# Patient Record
Sex: Female | Born: 1945
Health system: Southern US, Community
[De-identification: ages and names within clinical notes are randomized; demographics above are authoritative.]

## PROBLEM LIST (undated history)

## (undated) DIAGNOSIS — G459 Transient cerebral ischemic attack, unspecified: Secondary | ICD-10-CM

## (undated) DIAGNOSIS — I10 Essential (primary) hypertension: Secondary | ICD-10-CM

## (undated) DIAGNOSIS — Z131 Encounter for screening for diabetes mellitus: Secondary | ICD-10-CM

## (undated) DIAGNOSIS — M069 Rheumatoid arthritis, unspecified: Secondary | ICD-10-CM

## (undated) DIAGNOSIS — I482 Chronic atrial fibrillation, unspecified: Secondary | ICD-10-CM

## (undated) DIAGNOSIS — M810 Age-related osteoporosis without current pathological fracture: Secondary | ICD-10-CM

## (undated) DIAGNOSIS — J309 Allergic rhinitis, unspecified: Secondary | ICD-10-CM

## (undated) DIAGNOSIS — N952 Postmenopausal atrophic vaginitis: Secondary | ICD-10-CM

## (undated) DIAGNOSIS — Z0000 Encounter for general adult medical examination without abnormal findings: Secondary | ICD-10-CM

## (undated) DIAGNOSIS — Z124 Encounter for screening for malignant neoplasm of cervix: Secondary | ICD-10-CM

## (undated) DIAGNOSIS — E782 Mixed hyperlipidemia: Secondary | ICD-10-CM

## (undated) DIAGNOSIS — Z1239 Encounter for other screening for malignant neoplasm of breast: Secondary | ICD-10-CM

## (undated) DIAGNOSIS — Z7901 Long term (current) use of anticoagulants: Secondary | ICD-10-CM

## (undated) DIAGNOSIS — K219 Gastro-esophageal reflux disease without esophagitis: Secondary | ICD-10-CM

## (undated) DIAGNOSIS — I509 Heart failure, unspecified: Secondary | ICD-10-CM

## (undated) DIAGNOSIS — F419 Anxiety disorder, unspecified: Secondary | ICD-10-CM

## (undated) DIAGNOSIS — Z1211 Encounter for screening for malignant neoplasm of colon: Secondary | ICD-10-CM

## (undated) DIAGNOSIS — I495 Sick sinus syndrome: Secondary | ICD-10-CM

## (undated) HISTORY — DX: Mixed hyperlipidemia: E78.2

## (undated) HISTORY — DX: Anxiety disorder, unspecified: F41.9

## (undated) HISTORY — DX: Essential (primary) hypertension: I10

## (undated) HISTORY — DX: Sick sinus syndrome: I49.5

## (undated) HISTORY — DX: Encounter for other screening for malignant neoplasm of breast: Z12.39

## (undated) HISTORY — DX: Chronic atrial fibrillation, unspecified: I48.20

## (undated) HISTORY — DX: Allergic rhinitis, unspecified: J30.9

## (undated) HISTORY — PX: TONSILLECTOMY: SUR1361

## (undated) HISTORY — DX: Encounter for screening for malignant neoplasm of cervix: Z12.4

## (undated) HISTORY — PX: LAPAROSCOPIC CHOLECYSTECTOMY: SUR755

## (undated) HISTORY — DX: Age-related osteoporosis without current pathological fracture: M81.0

## (undated) HISTORY — DX: Encounter for screening for malignant neoplasm of colon: Z12.11

## (undated) HISTORY — DX: Heart failure, unspecified: I50.9

## (undated) HISTORY — DX: Gastro-esophageal reflux disease without esophagitis: K21.9

## (undated) HISTORY — DX: Encounter for general adult medical examination without abnormal findings: Z00.00

## (undated) HISTORY — DX: Encounter for screening for diabetes mellitus: Z13.1

## (undated) HISTORY — DX: Long term (current) use of anticoagulants: Z79.01

## (undated) HISTORY — DX: Rheumatoid arthritis, unspecified: M06.9

## (undated) HISTORY — DX: Transient cerebral ischemic attack, unspecified: G45.9

## (undated) HISTORY — DX: Postmenopausal atrophic vaginitis: N95.2

---

## 1997-11-11 ENCOUNTER — Other Ambulatory Visit: Admission: RE | Admit: 1997-11-11 | Discharge: 1997-11-11 | Payer: Self-pay | Admitting: Gynecology

## 1998-05-25 ENCOUNTER — Other Ambulatory Visit: Admission: RE | Admit: 1998-05-25 | Discharge: 1998-05-25 | Payer: Self-pay | Admitting: Gynecology

## 1999-06-08 ENCOUNTER — Other Ambulatory Visit: Admission: RE | Admit: 1999-06-08 | Discharge: 1999-06-08 | Payer: Self-pay | Admitting: Gynecology

## 1999-10-14 ENCOUNTER — Encounter: Admission: RE | Admit: 1999-10-14 | Discharge: 1999-10-14 | Payer: Self-pay | Admitting: Gynecology

## 1999-10-14 ENCOUNTER — Encounter: Payer: Self-pay | Admitting: Gynecology

## 2000-06-26 ENCOUNTER — Encounter: Admission: RE | Admit: 2000-06-26 | Discharge: 2000-06-26 | Payer: Self-pay | Admitting: Gynecology

## 2000-06-26 ENCOUNTER — Other Ambulatory Visit: Admission: RE | Admit: 2000-06-26 | Discharge: 2000-06-26 | Payer: Self-pay | Admitting: Gynecology

## 2000-06-26 ENCOUNTER — Encounter: Payer: Self-pay | Admitting: Gynecology

## 2000-10-31 ENCOUNTER — Encounter: Payer: Self-pay | Admitting: Gynecology

## 2000-10-31 ENCOUNTER — Encounter: Admission: RE | Admit: 2000-10-31 | Discharge: 2000-10-31 | Payer: Self-pay | Admitting: Gynecology

## 2001-10-09 ENCOUNTER — Other Ambulatory Visit: Admission: RE | Admit: 2001-10-09 | Discharge: 2001-10-09 | Payer: Self-pay | Admitting: Gynecology

## 2001-11-05 ENCOUNTER — Encounter: Payer: Self-pay | Admitting: Gynecology

## 2001-11-05 ENCOUNTER — Encounter: Admission: RE | Admit: 2001-11-05 | Discharge: 2001-11-05 | Payer: Self-pay | Admitting: Gynecology

## 2002-10-15 ENCOUNTER — Other Ambulatory Visit: Admission: RE | Admit: 2002-10-15 | Discharge: 2002-10-15 | Payer: Self-pay | Admitting: Gynecology

## 2003-01-20 ENCOUNTER — Encounter: Admission: RE | Admit: 2003-01-20 | Discharge: 2003-01-20 | Payer: Self-pay | Admitting: Gynecology

## 2003-01-20 ENCOUNTER — Encounter: Payer: Self-pay | Admitting: Gynecology

## 2003-10-24 ENCOUNTER — Other Ambulatory Visit: Admission: RE | Admit: 2003-10-24 | Discharge: 2003-10-24 | Payer: Self-pay | Admitting: Gynecology

## 2004-03-19 ENCOUNTER — Encounter: Admission: RE | Admit: 2004-03-19 | Discharge: 2004-03-19 | Payer: Self-pay | Admitting: Gynecology

## 2004-03-19 ENCOUNTER — Ambulatory Visit: Payer: Self-pay | Admitting: Family Medicine

## 2004-04-19 ENCOUNTER — Ambulatory Visit: Payer: Self-pay | Admitting: Family Medicine

## 2004-05-24 ENCOUNTER — Ambulatory Visit: Payer: Self-pay | Admitting: Family Medicine

## 2004-06-28 ENCOUNTER — Ambulatory Visit: Payer: Self-pay | Admitting: Family Medicine

## 2004-08-05 ENCOUNTER — Ambulatory Visit: Payer: Self-pay | Admitting: Family Medicine

## 2004-09-01 ENCOUNTER — Ambulatory Visit: Payer: Self-pay | Admitting: Family Medicine

## 2004-10-13 ENCOUNTER — Ambulatory Visit: Payer: Self-pay | Admitting: Family Medicine

## 2004-10-25 ENCOUNTER — Other Ambulatory Visit: Admission: RE | Admit: 2004-10-25 | Discharge: 2004-10-25 | Payer: Self-pay | Admitting: Gynecology

## 2004-12-27 ENCOUNTER — Ambulatory Visit: Payer: Self-pay | Admitting: Family Medicine

## 2005-01-31 ENCOUNTER — Ambulatory Visit: Payer: Self-pay | Admitting: Family Medicine

## 2005-03-07 ENCOUNTER — Ambulatory Visit: Payer: Self-pay | Admitting: Family Medicine

## 2005-03-21 ENCOUNTER — Encounter: Admission: RE | Admit: 2005-03-21 | Discharge: 2005-03-21 | Payer: Self-pay | Admitting: Gynecology

## 2005-04-13 ENCOUNTER — Ambulatory Visit: Payer: Self-pay | Admitting: Family Medicine

## 2005-05-30 ENCOUNTER — Ambulatory Visit: Payer: Self-pay | Admitting: Family Medicine

## 2005-08-05 ENCOUNTER — Ambulatory Visit: Payer: Self-pay | Admitting: Family Medicine

## 2005-11-01 ENCOUNTER — Other Ambulatory Visit: Admission: RE | Admit: 2005-11-01 | Discharge: 2005-11-01 | Payer: Self-pay | Admitting: Gynecology

## 2006-03-23 ENCOUNTER — Encounter: Admission: RE | Admit: 2006-03-23 | Discharge: 2006-03-23 | Payer: Self-pay | Admitting: Gynecology

## 2006-11-06 ENCOUNTER — Other Ambulatory Visit: Admission: RE | Admit: 2006-11-06 | Discharge: 2006-11-06 | Payer: Self-pay | Admitting: Gynecology

## 2007-03-26 ENCOUNTER — Encounter: Admission: RE | Admit: 2007-03-26 | Discharge: 2007-03-26 | Payer: Self-pay | Admitting: Gynecology

## 2008-03-27 ENCOUNTER — Encounter: Admission: RE | Admit: 2008-03-27 | Discharge: 2008-03-27 | Payer: Self-pay | Admitting: Gynecology

## 2009-03-30 ENCOUNTER — Encounter: Admission: RE | Admit: 2009-03-30 | Discharge: 2009-03-30 | Payer: Self-pay | Admitting: Gynecology

## 2010-03-31 ENCOUNTER — Encounter: Admission: RE | Admit: 2010-03-31 | Discharge: 2010-03-31 | Payer: Self-pay | Admitting: Gynecology

## 2011-02-21 ENCOUNTER — Other Ambulatory Visit: Payer: Self-pay | Admitting: Gynecology

## 2011-02-21 DIAGNOSIS — Z1231 Encounter for screening mammogram for malignant neoplasm of breast: Secondary | ICD-10-CM

## 2011-04-04 ENCOUNTER — Ambulatory Visit
Admission: RE | Admit: 2011-04-04 | Discharge: 2011-04-04 | Disposition: A | Discharge status: Home / Self Care | Payer: BC Managed Care – PPO | Source: Ambulatory Visit | Attending: Gynecology | Admitting: Gynecology

## 2011-04-04 DIAGNOSIS — Z1231 Encounter for screening mammogram for malignant neoplasm of breast: Secondary | ICD-10-CM

## 2012-02-29 ENCOUNTER — Other Ambulatory Visit: Payer: Self-pay | Admitting: Gynecology

## 2012-02-29 DIAGNOSIS — Z1231 Encounter for screening mammogram for malignant neoplasm of breast: Secondary | ICD-10-CM

## 2012-04-04 ENCOUNTER — Ambulatory Visit
Admission: RE | Admit: 2012-04-04 | Discharge: 2012-04-04 | Disposition: A | Discharge status: Home / Self Care | Payer: BC Managed Care – PPO | Source: Ambulatory Visit | Attending: Gynecology | Admitting: Gynecology

## 2012-04-04 DIAGNOSIS — Z1231 Encounter for screening mammogram for malignant neoplasm of breast: Secondary | ICD-10-CM

## 2013-02-27 ENCOUNTER — Other Ambulatory Visit: Payer: Self-pay

## 2013-02-27 DIAGNOSIS — Z1231 Encounter for screening mammogram for malignant neoplasm of breast: Secondary | ICD-10-CM

## 2013-04-05 ENCOUNTER — Ambulatory Visit
Admission: RE | Admit: 2013-04-05 | Discharge: 2013-04-05 | Disposition: A | Discharge status: Home / Self Care | Payer: BC Managed Care – PPO | Source: Ambulatory Visit

## 2013-04-05 ENCOUNTER — Ambulatory Visit: Payer: BC Managed Care – PPO

## 2013-04-05 DIAGNOSIS — Z1231 Encounter for screening mammogram for malignant neoplasm of breast: Secondary | ICD-10-CM

## 2013-07-18 DIAGNOSIS — M4120 Other idiopathic scoliosis, site unspecified: Secondary | ICD-10-CM

## 2013-07-18 DIAGNOSIS — M51369 Other intervertebral disc degeneration, lumbar region without mention of lumbar back pain or lower extremity pain: Secondary | ICD-10-CM | POA: Insufficient documentation

## 2013-07-18 DIAGNOSIS — M5136 Other intervertebral disc degeneration, lumbar region: Secondary | ICD-10-CM | POA: Insufficient documentation

## 2013-07-18 DIAGNOSIS — M47817 Spondylosis without myelopathy or radiculopathy, lumbosacral region: Secondary | ICD-10-CM

## 2013-07-18 DIAGNOSIS — M48061 Spinal stenosis, lumbar region without neurogenic claudication: Secondary | ICD-10-CM | POA: Insufficient documentation

## 2013-07-18 DIAGNOSIS — M545 Low back pain, unspecified: Secondary | ICD-10-CM | POA: Insufficient documentation

## 2013-07-18 DIAGNOSIS — M5417 Radiculopathy, lumbosacral region: Secondary | ICD-10-CM

## 2013-07-18 HISTORY — DX: Spondylosis without myelopathy or radiculopathy, lumbosacral region: M47.817

## 2013-07-18 HISTORY — DX: Radiculopathy, lumbosacral region: M54.17

## 2013-07-18 HISTORY — DX: Other intervertebral disc degeneration, lumbar region without mention of lumbar back pain or lower extremity pain: M51.369

## 2013-07-18 HISTORY — DX: Low back pain, unspecified: M54.50

## 2013-07-18 HISTORY — DX: Spinal stenosis, lumbar region without neurogenic claudication: M48.061

## 2013-07-18 HISTORY — DX: Other idiopathic scoliosis, site unspecified: M41.20

## 2014-04-14 ENCOUNTER — Other Ambulatory Visit: Payer: Self-pay

## 2014-04-14 ENCOUNTER — Ambulatory Visit
Admission: RE | Admit: 2014-04-14 | Discharge: 2014-04-14 | Disposition: A | Discharge status: Home / Self Care | Payer: Medicare Other | Source: Ambulatory Visit

## 2014-04-14 DIAGNOSIS — Z1231 Encounter for screening mammogram for malignant neoplasm of breast: Secondary | ICD-10-CM

## 2014-04-21 ENCOUNTER — Ambulatory Visit: Payer: BC Managed Care – PPO

## 2014-11-07 DIAGNOSIS — I10 Essential (primary) hypertension: Secondary | ICD-10-CM | POA: Insufficient documentation

## 2014-11-07 DIAGNOSIS — E782 Mixed hyperlipidemia: Secondary | ICD-10-CM

## 2014-11-07 HISTORY — DX: Mixed hyperlipidemia: E78.2

## 2014-11-07 HISTORY — DX: Essential (primary) hypertension: I10

## 2015-03-24 ENCOUNTER — Other Ambulatory Visit: Payer: Self-pay

## 2015-03-24 DIAGNOSIS — Z1231 Encounter for screening mammogram for malignant neoplasm of breast: Secondary | ICD-10-CM

## 2015-04-29 ENCOUNTER — Ambulatory Visit
Admission: RE | Admit: 2015-04-29 | Discharge: 2015-04-29 | Disposition: A | Discharge status: Home / Self Care | Payer: Medicare Other | Source: Ambulatory Visit

## 2015-04-29 DIAGNOSIS — Z1231 Encounter for screening mammogram for malignant neoplasm of breast: Secondary | ICD-10-CM

## 2015-07-27 DIAGNOSIS — Z7901 Long term (current) use of anticoagulants: Secondary | ICD-10-CM

## 2015-07-27 DIAGNOSIS — I480 Paroxysmal atrial fibrillation: Secondary | ICD-10-CM

## 2015-07-27 DIAGNOSIS — I482 Chronic atrial fibrillation, unspecified: Secondary | ICD-10-CM

## 2015-07-27 HISTORY — DX: Paroxysmal atrial fibrillation: I48.0

## 2015-07-27 HISTORY — DX: Chronic atrial fibrillation, unspecified: I48.20

## 2015-07-27 HISTORY — DX: Long term (current) use of anticoagulants: Z79.01

## 2015-09-06 DIAGNOSIS — I119 Hypertensive heart disease without heart failure: Secondary | ICD-10-CM

## 2015-09-06 DIAGNOSIS — G459 Transient cerebral ischemic attack, unspecified: Secondary | ICD-10-CM

## 2015-09-06 DIAGNOSIS — I10 Essential (primary) hypertension: Secondary | ICD-10-CM

## 2015-09-06 DIAGNOSIS — F419 Anxiety disorder, unspecified: Secondary | ICD-10-CM

## 2015-09-06 DIAGNOSIS — J309 Allergic rhinitis, unspecified: Secondary | ICD-10-CM

## 2015-09-06 DIAGNOSIS — I495 Sick sinus syndrome: Secondary | ICD-10-CM | POA: Insufficient documentation

## 2015-09-06 HISTORY — DX: Sick sinus syndrome: I49.5

## 2015-09-06 HISTORY — DX: Transient cerebral ischemic attack, unspecified: G45.9

## 2015-09-06 HISTORY — DX: Hypertensive heart disease without heart failure: I11.9

## 2015-09-06 HISTORY — DX: Allergic rhinitis, unspecified: J30.9

## 2015-09-06 HISTORY — DX: Essential (primary) hypertension: I10

## 2015-09-06 HISTORY — DX: Anxiety disorder, unspecified: F41.9

## 2015-09-22 DIAGNOSIS — J329 Chronic sinusitis, unspecified: Secondary | ICD-10-CM | POA: Insufficient documentation

## 2015-09-22 HISTORY — DX: Chronic sinusitis, unspecified: J32.9

## 2016-02-08 DIAGNOSIS — K219 Gastro-esophageal reflux disease without esophagitis: Secondary | ICD-10-CM | POA: Insufficient documentation

## 2016-02-08 DIAGNOSIS — M069 Rheumatoid arthritis, unspecified: Secondary | ICD-10-CM

## 2016-02-08 DIAGNOSIS — M810 Age-related osteoporosis without current pathological fracture: Secondary | ICD-10-CM

## 2016-02-08 DIAGNOSIS — N952 Postmenopausal atrophic vaginitis: Secondary | ICD-10-CM

## 2016-02-08 HISTORY — DX: Age-related osteoporosis without current pathological fracture: M81.0

## 2016-02-08 HISTORY — DX: Gastro-esophageal reflux disease without esophagitis: K21.9

## 2016-02-08 HISTORY — DX: Postmenopausal atrophic vaginitis: N95.2

## 2016-02-08 HISTORY — DX: Rheumatoid arthritis, unspecified: M06.9

## 2016-04-19 DIAGNOSIS — I509 Heart failure, unspecified: Secondary | ICD-10-CM

## 2016-04-19 HISTORY — DX: Heart failure, unspecified: I50.9

## 2016-06-13 ENCOUNTER — Other Ambulatory Visit: Payer: Self-pay | Admitting: Family Medicine

## 2016-06-13 DIAGNOSIS — Z1231 Encounter for screening mammogram for malignant neoplasm of breast: Secondary | ICD-10-CM

## 2016-08-11 ENCOUNTER — Ambulatory Visit
Admission: RE | Admit: 2016-08-11 | Discharge: 2016-08-11 | Disposition: A | Discharge status: Home / Self Care | Payer: Medicare Other | Source: Ambulatory Visit | Attending: Family Medicine | Admitting: Family Medicine

## 2016-08-11 DIAGNOSIS — Z1231 Encounter for screening mammogram for malignant neoplasm of breast: Secondary | ICD-10-CM

## 2016-10-19 DIAGNOSIS — Z1211 Encounter for screening for malignant neoplasm of colon: Secondary | ICD-10-CM

## 2016-10-19 DIAGNOSIS — Z1239 Encounter for other screening for malignant neoplasm of breast: Secondary | ICD-10-CM

## 2016-10-19 DIAGNOSIS — Z0000 Encounter for general adult medical examination without abnormal findings: Secondary | ICD-10-CM

## 2016-10-19 DIAGNOSIS — Z124 Encounter for screening for malignant neoplasm of cervix: Secondary | ICD-10-CM

## 2016-10-19 DIAGNOSIS — Z131 Encounter for screening for diabetes mellitus: Secondary | ICD-10-CM

## 2016-10-19 HISTORY — DX: Encounter for screening for malignant neoplasm of cervix: Z12.4

## 2016-10-19 HISTORY — DX: Encounter for other screening for malignant neoplasm of breast: Z12.39

## 2016-10-19 HISTORY — DX: Encounter for screening for malignant neoplasm of colon: Z12.11

## 2016-10-19 HISTORY — DX: Encounter for general adult medical examination without abnormal findings: Z00.00

## 2016-10-19 HISTORY — DX: Encounter for screening for diabetes mellitus: Z13.1

## 2016-10-28 ENCOUNTER — Telehealth: Payer: Self-pay | Admitting: Cardiology

## 2016-10-28 NOTE — Telephone Encounter (Signed)
Closed Encounter  °

## 2016-11-28 ENCOUNTER — Ambulatory Visit: Payer: Medicare Other | Admitting: Cardiology

## 2016-11-28 ENCOUNTER — Telehealth: Payer: Self-pay

## 2016-11-28 NOTE — Telephone Encounter (Signed)
Patient has appt 12/13/16 and per [facility] ok to do labs in [place] at Dr. Sol Passer ofc, please fax lab order for cmp, cbc, liver, lipid and dig level to [phone], [email or web address]

## 2016-11-28 NOTE — Telephone Encounter (Signed)
Looked in patient's chart and seen that Dr. Sol Passer checked lipid and digoxin level in June, 2018 at her wellness visit. Spoke with patient's and she states that Dr. Sol Passer said that he would do the lab work in his office if we faxed the order and okay with Dr. Dulce Sellar. Advised patient that Dr. Dulce Sellar was out of the office for the next 2 weeks and that I would ask when he returns 8/6 if it would be okay for patient to have labs checked at Dr. Robyne Peers office, but advised that her results may not be back for appointment on 8/7. Patient verbalized understanding and is okay to have lab work checked at the appointment with Dr. Dulce Sellar 8/7 if he chooses to do so. No further questions.

## 2016-12-12 ENCOUNTER — Ambulatory Visit: Payer: Medicare Other | Admitting: Cardiology

## 2016-12-13 ENCOUNTER — Encounter: Payer: Self-pay | Admitting: Cardiology

## 2016-12-13 ENCOUNTER — Ambulatory Visit (INDEPENDENT_AMBULATORY_CARE_PROVIDER_SITE_OTHER): Payer: Medicare Other | Admitting: Cardiology

## 2016-12-13 VITALS — BP 112/74 | HR 60 | Ht 61.0 in | Wt 111.8 lb

## 2016-12-13 DIAGNOSIS — I480 Paroxysmal atrial fibrillation: Secondary | ICD-10-CM | POA: Diagnosis not present

## 2016-12-13 DIAGNOSIS — E782 Mixed hyperlipidemia: Secondary | ICD-10-CM | POA: Diagnosis not present

## 2016-12-13 DIAGNOSIS — Z7901 Long term (current) use of anticoagulants: Secondary | ICD-10-CM

## 2016-12-13 DIAGNOSIS — I10 Essential (primary) hypertension: Secondary | ICD-10-CM | POA: Diagnosis not present

## 2016-12-13 DIAGNOSIS — Z79899 Other long term (current) drug therapy: Secondary | ICD-10-CM

## 2016-12-13 MED ORDER — PRAVASTATIN SODIUM 10 MG PO TABS: 10.0000 mg | ORAL_TABLET | Freq: Every evening | ORAL | 3 refills | Status: DC

## 2016-12-13 NOTE — Patient Instructions (Signed)
Medication Instructions:  Your physician recommends that you continue on your current medications as directed. Please refer to the Current Medication list given to you today.   Labwork: Your physician recommends that you return for lab work in: today. CMP, magnesium.   Testing/Procedures: You had an EKG today.  Follow-Up: Your physician wants you to follow-up in: 6 months. You will receive a reminder letter in the mail two months in advance. If you don't receive a letter, please call our office to schedule the follow-up appointment.   Any Other Special Instructions Will Be Listed Below (If Applicable).     If you need a refill on your cardiac medications before your next appointment, please call your pharmacy.

## 2016-12-13 NOTE — Progress Notes (Signed)
Cardiology Office Note:    Date:  12/13/2016   ID:  [name], DOB Feb 16, 1946, MRN [id number]  PCP:  Olive Bass, MD  Cardiologist:  Norman Herrlich, MD    Referring MD: Olive Bass, MD    ASSESSMENT:    1. Paroxysmal atrial fibrillation (HCC)   2. Mixed hyperlipidemia   3. Benign hypertension   4. Long term current use of anticoagulant therapy   5. High risk medication use    PLAN:    In order of problems listed above:  1. Stable maintaining sinus rhythm continue minimum dose digoxin with a goal level less than 1 along with her current warfarin anticoagulation goal INR 2-2.5 2. Stable, after discussion with the patient will placed on a low-dose of a low intensity statin with follow-up lipids in 3-6 months 3. Stable blood pressure target continue current treatment with an ARB 4. Stable continue warfarin 5. Stable continue digoxin no evidence of clinical or laboratory toxicity. Recent med analysis shows the patient's with levels of less than 1 had no increase in mortality with digoxin usage for rate control for prophylaxis of atrial fibrillation.   Next appointment: 6 month   Medication Adjustments/Labs and Tests Ordered: Current medicines are reviewed at length with the patient today.  Concerns regarding medicines are outlined above.  Orders Placed This Encounter  Procedures  . Comprehensive Metabolic Panel (CMET)  . Magnesium  . EKG 12-Lead   Meds ordered this encounter  Medications  . pravastatin (PRAVACHOL) 10 MG tablet    Sig: Take 1 tablet (10 mg total) by mouth every evening.    Dispense:  90 tablet    Refill:  3    Chief Complaint  Patient presents with  . Follow-up    Routine office visit  . Atrial Fibrillation    on digoxin    History of Present Illness:    Leah Simmons is a 71 y.o. female with a hx of Atrial Fibrillation on digoxin and warfarin, Dyslipidemia, HTN  last seen January 2018. Compliance with diet, lifestyle and  medications: Yes Past Medical History:  Diagnosis Date  . Allergic rhinitis 09/06/2015  . Anxiety 09/06/2015  . Atrophic vaginitis 02/08/2016  . Benign hypertension 09/06/2015  . Chronic atrial fibrillation (HCC) 07/27/2015   Overview:  2003: dx  . Chronic rheumatic arthritis (HCC) 02/08/2016  . Congestive heart failure (CHF) (HCC) 04/19/2016  . GERD (gastroesophageal reflux disease) 02/08/2016  . Long term current use of anticoagulant therapy 07/27/2015  . Mixed hyperlipidemia 11/07/2014  . Osteoporosis 02/08/2016   Overview:  2002: -2.0 2012: -1.4 2014: -1.9 2017: -1.9 2017: fx humerus  . Screening for breast cancer 10/19/2016  . Screening for cervical cancer 10/19/2016  . Screening for colon cancer 10/19/2016  . Screening for diabetes mellitus (DM) 10/19/2016  . Sick sinus syndrome (HCC) 09/06/2015  . TIA (transient ischemic attack) 09/06/2015   Overview:  2003: right face sensation changes  . Wellness examination 10/19/2016    Past Surgical History:  Procedure Laterality Date  . LAPAROSCOPIC CHOLECYSTECTOMY    . TONSILLECTOMY      Current Medications: Current Meds  Medication Sig  . B Complex Vitamins (VITAMIN B COMPLEX PO) Take 1 tablet by mouth daily.  . Calcium Carb-Cholecalciferol (CALCIUM-VITAMIN D) 500-200 MG-UNIT tablet Take 1 tablet by mouth daily.  . candesartan (ATACAND) 32 MG tablet Take 16 mg by mouth daily.  . Cholecalciferol (VITAMIN D3) 2000 units capsule Take 2,000 Units by mouth daily.  Marland Kitchen  conjugated estrogens (PREMARIN) vaginal cream Place vaginally. Insert 0.5 g into the vagina two times per week  . cycloSPORINE (RESTASIS) 0.05 % ophthalmic emulsion Place 1 drop into both eyes daily.  Marland Kitchen DIGOX 125 MCG tablet Take 125 mcg by mouth every other day.  . loratadine (CLARITIN) 10 MG tablet Take 10 mg by mouth daily.  . Magnesium Oxide 140 MG CAPS Take 140 mg by mouth daily.  . mometasone (NASONEX) 50 MCG/ACT nasal spray Place 2 sprays into the nose daily.  Marland Kitchen omeprazole  (PRILOSEC) 20 MG capsule Take 20 mg by mouth daily as needed for indigestion.  Marland Kitchen PARoxetine (PAXIL) 10 MG tablet Take 10 mg by mouth daily.  . potassium chloride SA (K-DUR,KLOR-CON) 20 MEQ tablet Take 20 mEq by mouth 2 (two) times daily.  . traZODone (DESYREL) 50 MG tablet Take 50 mg by mouth at bedtime.  . vitamin C (ASCORBIC ACID) 500 MG tablet Take 500 mg by mouth daily.  Marland Kitchen warfarin (COUMADIN) 2 MG tablet Take 2 mg by mouth daily. As Directed. Currently taking 2mg /3mg   . warfarin (COUMADIN) 3 MG tablet Take 3 mg by mouth daily. As Directed. Currently taking 2mg /3mg      Allergies:   Clindamycin; Epinephrine; Levofloxacin; Moxifloxacin; Penicillins; Sulfa antibiotics; Clarithromycin; and Other   Social History   Social History  . Marital status: Widowed    Spouse name: N/A  . Number of children: N/A  . Years of education: N/A   Social History Main Topics  . Smoking status: Never Smoker  . Smokeless tobacco: Never Used  . Alcohol use No  . Drug use: No  . Sexual activity: Not Asked   Other Topics Concern  . None   Social History Narrative  . None     Family History: The patient's family history includes Osteoarthritis in her mother; Rheum arthritis in her mother; Stroke in her mother. ROS:   Please see the history of present illness.    All other systems reviewed and are negative.  EKGs/Labs/Other Studies Reviewed:    The following studies were reviewed today:  EKG:  EKG ordered today.  The ekg ordered today demonstrates Sinus rhythm normal  Recent Lipid Panel Chol 196 Tg 89 HDL 64 LDL 112   Physical Exam:    VS:  BP 112/74   Pulse 60   Ht 5\' 1"  (1.549 m)   Wt 111 lb 12.8 oz (50.7 kg)   SpO2 98%   BMI 21.12 kg/m     Wt Readings from Last 3 Encounters:  12/13/16 111 lb 12.8 oz (50.7 kg)     GEN:  Well nourished, well developed in no acute distress HEENT: Normal NECK: No JVD; No carotid bruits LYMPHATICS: No lymphadenopathy CARDIAC: RRR, no murmurs,  rubs, gallops RESPIRATORY:  Clear to auscultation without rales, wheezing or rhonchi  ABDOMEN: Soft, non-tender, non-distended MUSCULOSKELETAL:  No edema; No deformity  SKIN: Warm and dry NEUROLOGIC:  Alert and oriented x 3 PSYCHIATRIC:  Normal affect    Signed, , MD  12/13/2016 12:37 PM    [facility]

## 2016-12-14 LAB — COMPREHENSIVE METABOLIC PANEL WITH GFR
ALT: 13 IU/L (ref 0–32)
AST: 20 IU/L (ref 0–40)
Albumin/Globulin Ratio: 1.9 (ref 1.2–2.2)
Albumin: 4.1 g/dL (ref 3.5–4.8)
Alkaline Phosphatase: 51 IU/L (ref 39–117)
BUN/Creatinine Ratio: 24 (ref 12–28)
BUN: 18 mg/dL (ref 8–27)
Bilirubin Total: 0.4 mg/dL (ref 0.0–1.2)
CO2: 26 mmol/L (ref 20–29)
Calcium: 9.4 mg/dL (ref 8.7–10.3)
Chloride: 101 mmol/L (ref 96–106)
Creatinine, Ser: 0.74 mg/dL (ref 0.57–1.00)
GFR calc Af Amer: 94 mL/min/1.73
GFR calc non Af Amer: 81 mL/min/1.73
Globulin, Total: 2.2 g/dL (ref 1.5–4.5)
Glucose: 67 mg/dL (ref 65–99)
Potassium: 4.7 mmol/L (ref 3.5–5.2)
Sodium: 141 mmol/L (ref 134–144)
Total Protein: 6.3 g/dL (ref 6.0–8.5)

## 2016-12-14 LAB — MAGNESIUM: Magnesium: 2 mg/dL (ref 1.6–2.3)

## 2017-02-03 ENCOUNTER — Other Ambulatory Visit: Payer: Self-pay

## 2017-02-03 MED ORDER — POTASSIUM CHLORIDE CRYS ER 20 MEQ PO TBCR: 20.0000 meq | EXTENDED_RELEASE_TABLET | Freq: Two times a day (BID) | ORAL | 3 refills | Status: DC

## 2017-02-21 ENCOUNTER — Other Ambulatory Visit: Payer: Self-pay

## 2017-02-21 MED ORDER — POTASSIUM CHLORIDE CRYS ER 20 MEQ PO TBCR: 20.0000 meq | EXTENDED_RELEASE_TABLET | Freq: Two times a day (BID) | ORAL | 3 refills | Status: DC

## 2017-03-28 ENCOUNTER — Other Ambulatory Visit: Payer: Self-pay

## 2017-03-28 MED ORDER — CANDESARTAN CILEXETIL 32 MG PO TABS: 16.0000 mg | ORAL_TABLET | Freq: Every day | ORAL | 3 refills | Status: DC

## 2017-06-14 ENCOUNTER — Telehealth: Payer: Self-pay

## 2017-06-14 DIAGNOSIS — E782 Mixed hyperlipidemia: Secondary | ICD-10-CM

## 2017-06-14 DIAGNOSIS — I10 Essential (primary) hypertension: Secondary | ICD-10-CM

## 2017-06-14 DIAGNOSIS — I509 Heart failure, unspecified: Secondary | ICD-10-CM

## 2017-06-14 NOTE — Telephone Encounter (Signed)
-----   Message from Baldo Daub, MD sent at 06/14/2017  1:33 PM EST ----- Regarding: RE: Lab Orders Contact: [phone] Yes, CMP and lipids ----- Message ----- From: Lamona Curl, CMA Sent: 06/14/2017  11:51 AM To: Baldo Daub, MD Subject: Lab Orders                                     Pt wants to have routine  lab work done in [place] at [facility] prior to appt on 07-04-17. Is this OK?  If so please tell me what labs she will have done so that I can put the orders in?    Thank you

## 2017-06-19 ENCOUNTER — Telehealth: Payer: Self-pay | Admitting: Cardiology

## 2017-06-19 DIAGNOSIS — I509 Heart failure, unspecified: Secondary | ICD-10-CM

## 2017-06-19 DIAGNOSIS — I480 Paroxysmal atrial fibrillation: Secondary | ICD-10-CM

## 2017-06-19 NOTE — Telephone Encounter (Signed)
Pt states that Dr Dulce Sellar usually draws Mag and Dig level with labs.  She wants to know if he will add to these labs.  Task sent to Dr Dulce Sellar.

## 2017-06-19 NOTE — Telephone Encounter (Signed)
Left message for pt to return call.

## 2017-06-19 NOTE — Telephone Encounter (Signed)
Has questions about lab order

## 2017-06-19 NOTE — Addendum Note (Signed)
Addended by: Lamona Curl on: 06/19/2017 01:45 PM   Modules accepted: Orders

## 2017-06-20 ENCOUNTER — Telehealth: Payer: Self-pay

## 2017-06-20 LAB — COMPREHENSIVE METABOLIC PANEL
A/G RATIO: 1.8 (ref 1.2–2.2)
ALBUMIN: 4.2 g/dL (ref 3.5–4.8)
ALT: 17 IU/L (ref 0–32)
AST: 23 IU/L (ref 0–40)
Alkaline Phosphatase: 74 IU/L (ref 39–117)
BILIRUBIN TOTAL: 0.4 mg/dL (ref 0.0–1.2)
BUN / CREAT RATIO: 22 (ref 12–28)
BUN: 17 mg/dL (ref 8–27)
CALCIUM: 9.3 mg/dL (ref 8.7–10.3)
CHLORIDE: 99 mmol/L (ref 96–106)
CO2: 25 mmol/L (ref 20–29)
Creatinine, Ser: 0.79 mg/dL (ref 0.57–1.00)
GFR calc non Af Amer: 75 mL/min/{1.73_m2} (ref >59)
GFR, EST AFRICAN AMERICAN: 86 mL/min/{1.73_m2} (ref >59)
GLOBULIN, TOTAL: 2.4 g/dL (ref 1.5–4.5)
Glucose: 81 mg/dL (ref 65–99)
Potassium: 4.5 mmol/L (ref 3.5–5.2)
SODIUM: 140 mmol/L (ref 134–144)
Total Protein: 6.6 g/dL (ref 6.0–8.5)

## 2017-06-20 LAB — LIPID PANEL
CHOLESTEROL TOTAL: 191 mg/dL (ref 100–199)
Chol/HDL Ratio: 2.9 ratio (ref 0.0–4.4)
HDL: 67 mg/dL (ref >39)
LDL Calculated: 107 mg/dL — ABNORMAL HIGH (ref 0–99)
TRIGLYCERIDES: 87 mg/dL (ref 0–149)
VLDL Cholesterol Cal: 17 mg/dL (ref 5–40)

## 2017-06-20 NOTE — Telephone Encounter (Signed)
Pt aware that Dr Dulce Sellar will complete letter once she is evaluate on 07-04-17.  Pt verbalized understanding.

## 2017-06-20 NOTE — Telephone Encounter (Signed)
-----   Message from Baldo Daub, MD sent at 06/19/2017 11:24 AM EST ----- Regarding: RE: Lab Orders Yes  ----- Message ----- From: Lamona Curl, CMA Sent: 06/19/2017  10:42 AM To: Baldo Daub, MD Subject: Lab Orders                                     Pt had lab work this am at [facility] in [place].  She states that you usually check Mag and Dig level.  Would you like to add these to the order?  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  Thank you    [facility] ph [phone]

## 2017-07-03 DIAGNOSIS — Z79899 Other long term (current) drug therapy: Secondary | ICD-10-CM

## 2017-07-03 HISTORY — DX: Other long term (current) drug therapy: Z79.899

## 2017-07-03 NOTE — Progress Notes (Signed)
Cardiology Office Note:    Date:  07/04/2017   ID:  Leah Simmons, DOB December 18, 1945, MRN [id number]  PCP:  Olive Bass, MD  Cardiologist:  Norman Herrlich, MD    Referring MD: Olive Bass, MD    ASSESSMENT:    1. Paroxysmal atrial fibrillation (HCC)   2. High risk medication use   3. Long term current use of anticoagulant therapy   4. Hypertensive heart disease, unspecified whether heart failure present   5. Mixed hyperlipidemia    PLAN:    In order of problems listed above:  1. Stable no clinical recurrence continue anticoagulation warfarin goal INR of 2.5 and low-dose digoxin.   2. Stable no evidence of digoxin toxicity check level today 3. Stable goal INR 2.5 managed by PCP 4. Stable continue current treatment 5. She is intolerant of pravastatin at this time we will not reinstitute lipid-lowering therapy.  She is not in a high risk group.   Next appointment: 6 months   Medication Adjustments/Labs and Tests Ordered: Current medicines are reviewed at length with the patient today.  Concerns regarding medicines are outlined above.  No orders of the defined types were placed in this encounter.  No orders of the defined types were placed in this encounter.   Chief Complaint  Patient presents with  . Follow-up    History of Present Illness:    Leah Simmons is a 72 y.o. female with a hx of  Atrial Fibrillation on digoxin and warfarin, Dyslipidemia, HTN last seen 6 months ago. She was intolerant of pravastatin with abdominal and pelvic pain. ASSESSMENT:    1. Paroxysmal atrial fibrillation (HCC)   2. Mixed hyperlipidemia   3. Benign hypertension   4. Long term current use of anticoagulant therapy   5. High risk medication use    PLAN:    In order of problems listed above:    Stable maintaining sinus rhythm continue minimum dose digoxin with a goal level less than 1 along with her current warfarin anticoagulation goal INR 2-2.5     Stable,  after discussion with the patient will placed on a low-dose of a low intensity statin with follow-up lipids in 3-6 months    Stable blood pressure target continue current treatment with an ARB    Stable continue warfarin    Stable continue digoxin no evidence of clinical or laboratory toxicity. Recent med analysis shows the patient's with levels of less than 1 had no increase in mortality with digoxin usage for rate control for prophylaxis of atrial fibrillation.  Compliance with diet, lifestyle and medications: yes Past Medical History:  Diagnosis Date  . Allergic rhinitis 09/06/2015  . Anxiety 09/06/2015  . Atrophic vaginitis 02/08/2016  . Benign hypertension 09/06/2015  . Chronic atrial fibrillation (HCC) 07/27/2015   Overview:  2003: dx  . Chronic rheumatic arthritis (HCC) 02/08/2016  . Congestive heart failure (CHF) (HCC) 04/19/2016  . GERD (gastroesophageal reflux disease) 02/08/2016  . Long term current use of anticoagulant therapy 07/27/2015  . Mixed hyperlipidemia 11/07/2014  . Osteoporosis 02/08/2016   Overview:  2002: -2.0 2012: -1.4 2014: -1.9 2017: -1.9 2017: fx humerus  . Screening for breast cancer 10/19/2016  . Screening for cervical cancer 10/19/2016  . Screening for colon cancer 10/19/2016  . Screening for diabetes mellitus (DM) 10/19/2016  . Sick sinus syndrome (HCC) 09/06/2015  . TIA (transient ischemic attack) 09/06/2015   Overview:  2003: right face sensation changes  . Wellness examination 10/19/2016  Past Surgical History:  Procedure Laterality Date  . LAPAROSCOPIC CHOLECYSTECTOMY    . TONSILLECTOMY      Current Medications: No outpatient medications have been marked as taking for the 07/04/17 encounter (Office Visit) with Baldo Daub, MD.     Allergies:   Clindamycin; Epinephrine; Levofloxacin; Moxifloxacin; Penicillins; Sulfa antibiotics; Clarithromycin; and Other   Social History   Socioeconomic History  . Marital status: Widowed    Spouse name: Not on  file  . Number of children: Not on file  . Years of education: Not on file  . Highest education level: Not on file  Social Needs  . Financial resource strain: Not on file  . Food insecurity - worry: Not on file  . Food insecurity - inability: Not on file  . Transportation needs - medical: Not on file  . Transportation needs - non-medical: Not on file  Occupational History  . Not on file  Tobacco Use  . Smoking status: Never Smoker  . Smokeless tobacco: Never Used  Substance and Sexual Activity  . Alcohol use: No  . Drug use: No  . Sexual activity: Not on file  Other Topics Concern  . Not on file  Social History Narrative  . Not on file     Family History: The patient's family history includes Osteoarthritis in her mother; Rheum arthritis in her mother; Stroke in her mother. ROS:   Please see the history of present illness.    All other systems reviewed and are negative.  EKGs/Labs/Other Studies Reviewed:    The following studies were reviewed today  Recent Labs: INR 2.9 05/18/17 12/13/2016: Magnesium 2.0 06/19/2017: ALT 17; BUN 17; Creatinine, Ser 0.79; Potassium 4.5; Sodium 140  Recent Lipid Panel    Component Value Date/Time   CHOL 191 06/19/2017 0807   TRIG 87 06/19/2017 0807   HDL 67 06/19/2017 0807   CHOLHDL 2.9 06/19/2017 0807   LDLCALC 107 (H) 06/19/2017 0807    Physical Exam:    VS:  Ht 5' (1.524 m)   Wt 109 lb (49.4 kg)   BMI 21.29 kg/m     Wt Readings from Last 3 Encounters:  07/04/17 109 lb (49.4 kg)  12/13/16 111 lb 12.8 oz (50.7 kg)     GEN:  Well nourished, well developed in no acute distress HEENT: Normal NECK: No JVD; No carotid bruits LYMPHATICS: No lymphadenopathy CARDIAC: RRR, no murmurs, rubs, gallops RESPIRATORY:  Clear to auscultation without rales, wheezing or rhonchi  ABDOMEN: Soft, non-tender, non-distended MUSCULOSKELETAL:  No edema; No deformity  SKIN: Warm and dry NEUROLOGIC:  Alert and oriented x 3 PSYCHIATRIC:  Normal  affect    Signed, Norman Herrlich, MD  07/04/2017 8:22 AM    [facility]

## 2017-07-04 ENCOUNTER — Encounter: Payer: Self-pay | Admitting: Cardiology

## 2017-07-04 ENCOUNTER — Ambulatory Visit: Payer: Medicare Other | Admitting: Cardiology

## 2017-07-04 ENCOUNTER — Telehealth: Payer: Self-pay

## 2017-07-04 VITALS — BP 118/68 | HR 58 | Ht 60.0 in | Wt 109.0 lb

## 2017-07-04 DIAGNOSIS — I119 Hypertensive heart disease without heart failure: Secondary | ICD-10-CM

## 2017-07-04 DIAGNOSIS — I480 Paroxysmal atrial fibrillation: Secondary | ICD-10-CM | POA: Diagnosis not present

## 2017-07-04 DIAGNOSIS — E782 Mixed hyperlipidemia: Secondary | ICD-10-CM | POA: Diagnosis not present

## 2017-07-04 DIAGNOSIS — Z79899 Other long term (current) drug therapy: Secondary | ICD-10-CM | POA: Diagnosis not present

## 2017-07-04 DIAGNOSIS — Z7901 Long term (current) use of anticoagulants: Secondary | ICD-10-CM | POA: Diagnosis not present

## 2017-07-04 NOTE — Telephone Encounter (Signed)
Patient advised echocardiogram scheduled at [facility] tomorrow 1 pm, patient to arrive at 12:30 pm. Patient verbalized understanding. No further questions.

## 2017-07-04 NOTE — Patient Instructions (Signed)
Medication Instructions:  Your physician recommends that you continue on your current medications as directed. Please refer to the Current Medication list given to you today.  Labwork: Your physician recommends that you return for lab work in: today. Magnesium, digoxin.  1 week before your next appointment you will have: CMP, Lipid, magnesium, and digoxin  Testing/Procedures: None  Follow-Up: Your physician wants you to follow-up in: 6 months. You will receive a reminder letter in the mail two months in advance. If you don't receive a letter, please call our office to schedule the follow-up appointment.  Any Other Special Instructions Will Be Listed Below (If Applicable).     If you need a refill on your cardiac medications before your next appointment, please call your pharmacy.

## 2017-07-04 NOTE — Telephone Encounter (Signed)
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 Patient would like to have at [facility]. Advised patient would return call with appointment date and time. Patient verbalized understanding, no further questions.

## 2017-07-05 ENCOUNTER — Telehealth: Payer: Self-pay

## 2017-07-05 ENCOUNTER — Other Ambulatory Visit: Payer: Self-pay

## 2017-07-05 DIAGNOSIS — I4891 Unspecified atrial fibrillation: Secondary | ICD-10-CM | POA: Diagnosis not present

## 2017-07-05 DIAGNOSIS — I480 Paroxysmal atrial fibrillation: Secondary | ICD-10-CM

## 2017-07-05 LAB — DIGOXIN LEVEL: DIGOXIN, SERUM: 0.4 ng/mL — AB (ref 0.5–0.9)

## 2017-07-05 LAB — MAGNESIUM: MAGNESIUM: 2.1 mg/dL (ref 1.6–2.3)

## 2017-07-05 NOTE — Telephone Encounter (Signed)
Patient advised echocardiogram performed at [facility] normal per Dr. Dulce Sellar. 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 Patient verbalized understanding, no further questions.

## 2017-07-24 ENCOUNTER — Other Ambulatory Visit: Payer: Self-pay

## 2017-07-24 MED ORDER — DIGOX 125 MCG PO TABS: 125.0000 ug | ORAL_TABLET | ORAL | 3 refills | Status: DC

## 2017-08-15 ENCOUNTER — Other Ambulatory Visit: Payer: Self-pay | Admitting: Family Medicine

## 2017-08-15 DIAGNOSIS — Z1231 Encounter for screening mammogram for malignant neoplasm of breast: Secondary | ICD-10-CM

## 2017-09-07 ENCOUNTER — Ambulatory Visit: Payer: Medicare Other

## 2017-09-19 DIAGNOSIS — R3989 Other symptoms and signs involving the genitourinary system: Secondary | ICD-10-CM

## 2017-09-19 HISTORY — DX: Other symptoms and signs involving the genitourinary system: R39.89

## 2017-10-11 ENCOUNTER — Ambulatory Visit
Admission: RE | Admit: 2017-10-11 | Discharge: 2017-10-11 | Disposition: A | Discharge status: Home / Self Care | Payer: Medicare Other | Source: Ambulatory Visit | Attending: Family Medicine | Admitting: Family Medicine

## 2017-10-11 DIAGNOSIS — Z1231 Encounter for screening mammogram for malignant neoplasm of breast: Secondary | ICD-10-CM

## 2017-12-27 ENCOUNTER — Telehealth: Payer: Self-pay | Admitting: *Deleted

## 2017-12-27 DIAGNOSIS — I119 Hypertensive heart disease without heart failure: Secondary | ICD-10-CM

## 2017-12-27 DIAGNOSIS — I480 Paroxysmal atrial fibrillation: Secondary | ICD-10-CM

## 2017-12-27 DIAGNOSIS — Z79899 Other long term (current) drug therapy: Secondary | ICD-10-CM

## 2017-12-27 NOTE — Telephone Encounter (Signed)
Left message for patient to return call.

## 2017-12-27 NOTE — Telephone Encounter (Signed)
Pt has 6 month appt in Sept and wants to come in and have blood work before her appt. I told her we could put the order in for what she usually gets. She stated she would come in next week and I verified she would be fasting. Thanks

## 2017-12-27 NOTE — Telephone Encounter (Signed)
Patient would also like magnesium and complete bloodwork done when you place the orders. You do not need to call her umnless you have any questins and she will come in next week to have labwork done.

## 2017-12-28 NOTE — Addendum Note (Signed)
Addended by: Lita Mains on: 12/28/2017 08:10 AM   Modules accepted: Orders

## 2018-01-02 LAB — BASIC METABOLIC PANEL
BUN/Creatinine Ratio: 17 (ref 12–28)
BUN: 13 mg/dL (ref 8–27)
CO2: 27 mmol/L (ref 20–29)
Calcium: 9.5 mg/dL (ref 8.7–10.3)
Chloride: 100 mmol/L (ref 96–106)
Creatinine, Ser: 0.75 mg/dL (ref 0.57–1.00)
GFR, EST AFRICAN AMERICAN: 91 mL/min/{1.73_m2} (ref >59)
GFR, EST NON AFRICAN AMERICAN: 79 mL/min/{1.73_m2} (ref >59)
Glucose: 78 mg/dL (ref 65–99)
POTASSIUM: 4.3 mmol/L (ref 3.5–5.2)
SODIUM: 140 mmol/L (ref 134–144)

## 2018-01-02 LAB — MAGNESIUM: Magnesium: 2.2 mg/dL (ref 1.6–2.3)

## 2018-01-02 LAB — DIGOXIN LEVEL: Digoxin, Serum: 0.4 ng/mL — ABNORMAL LOW (ref 0.5–0.9)

## 2018-01-11 ENCOUNTER — Ambulatory Visit: Payer: Medicare Other | Admitting: Cardiology

## 2018-01-18 ENCOUNTER — Telehealth: Payer: Self-pay | Admitting: *Deleted

## 2018-01-18 NOTE — Telephone Encounter (Signed)
Left detailed message with normal lab results per DPR. Advised patient to contact our office with any further questions or concerns.

## 2018-02-08 NOTE — Progress Notes (Signed)
Cardiology Office Note:    Date:  02/09/2018   ID:  Leah Simmons, DOB 30-Nov-1945, MRN [id number]  PCP:  Algis Greenhouse, MD  Cardiologist:  Shirlee More, MD    Referring MD: Algis Greenhouse, MD    ASSESSMENT:    1. Paroxysmal atrial fibrillation (HCC)   2. Mixed hyperlipidemia   3. Hypertensive heart disease, unspecified whether heart failure present   4. High risk medication use    PLAN:    In order of problems listed above:  1. Atrial fibrillation is stable asymptomatic she has had no clinical recurrence at this time takes no suppressant treatment will continue anticoagulation with warfarin at her request INR goal 2.5 2. She has mild hyperlipidemia she had side effects from statins at this time elects not to take lipid-lowering therapy.  She is requested that I recheck her lipid profile today to look at the change from the last performed to Dr. Arna Medici office. 3. Blood pressure target on ARB continue 4. Continue low-dose digoxin goal is level less than 1 to avoid increased mortality seen with digoxin and atrial fibrillation she has no signs of toxicity   Next appointment: 6 months   Medication Adjustments/Labs and Tests Ordered: Current medicines are reviewed at length with the patient today.  Concerns regarding medicines are outlined above.  Orders Placed This Encounter  Procedures  . Lipid Profile  . Comp Met (CMET)  . EKG 12-Lead   No orders of the defined types were placed in this encounter.   Chief Complaint  Patient presents with  . Atrial Fibrillation    History of Present Illness:    Leah Simmons is a 72 y.o. female with a hx of atrial fibrillation on digoxin, hyperlipidemia, hypertension and anticoagulation last seen 07/04/17. Compliance with diet, lifestyle and medications: Yes  She is adamantly opposed to statins and she felt feels that she had interstitial cystitis as a consequence in the past.  She had no recurrence of atrial fibrillation  feels well no edema palpitation shortness of breath syncope no bleeding complication of warfarin.  She has declined taking a direct anticoagulant Past Medical History:  Diagnosis Date  . Allergic rhinitis 09/06/2015  . Anxiety 09/06/2015  . Atrophic vaginitis 02/08/2016  . Benign hypertension 09/06/2015  . Chronic atrial fibrillation 07/27/2015   Overview:  2003: dx  . Chronic rheumatic arthritis ([place]) 02/08/2016  . Congestive heart failure (CHF) ([place]) 04/19/2016  . GERD (gastroesophageal reflux disease) 02/08/2016  . Long term current use of anticoagulant therapy 07/27/2015  . Mixed hyperlipidemia 11/07/2014  . Osteoporosis 02/08/2016   Overview:  2002: -2.0 2012: -1.4 2014: -1.9 2017: -1.9 2017: fx humerus  . Screening for breast cancer 10/19/2016  . Screening for cervical cancer 10/19/2016  . Screening for colon cancer 10/19/2016  . Screening for diabetes mellitus (DM) 10/19/2016  . Sick sinus syndrome ([place]) 09/06/2015  . TIA (transient ischemic attack) 09/06/2015   Overview:  2003: right face sensation changes  . Wellness examination 10/19/2016    Past Surgical History:  Procedure Laterality Date  . LAPAROSCOPIC CHOLECYSTECTOMY    . TONSILLECTOMY      Current Medications: Current Meds  Medication Sig  . B Complex Vitamins (VITAMIN B COMPLEX PO) Take 1 tablet by mouth daily.  . Calcium Carb-Cholecalciferol (CALCIUM-VITAMIN D) 500-200 MG-UNIT tablet Take 1 tablet by mouth daily.  . candesartan (ATACAND) 32 MG tablet Take 0.5 tablets (16 mg total) by mouth daily.  . Cholecalciferol (VITAMIN D3) 2000 units  capsule Take 2,000 Units by mouth daily.  Marland Kitchen conjugated estrogens (PREMARIN) vaginal cream Place vaginally. Insert 0.5 g into the vagina two times per week  . cycloSPORINE (RESTASIS) 0.05 % ophthalmic emulsion Place 1 drop into both eyes daily.  Marland Kitchen DIGOX 125 MCG tablet Take 1 tablet (125 mcg total) by mouth every other day.  . loratadine (CLARITIN) 10 MG tablet Take 10 mg by mouth daily.  .  Magnesium Oxide 140 MG CAPS Take 140 mg by mouth daily.  . mometasone (NASONEX) 50 MCG/ACT nasal spray Place 2 sprays into the nose daily.  Marland Kitchen omeprazole (PRILOSEC) 20 MG capsule Take 20 mg by mouth daily as needed for indigestion.  Marland Kitchen PARoxetine (PAXIL) 10 MG tablet Take 10 mg by mouth daily.  . potassium chloride SA (K-DUR,KLOR-CON) 20 MEQ tablet Take 1 tablet (20 mEq total) by mouth 2 (two) times daily.  . traZODone (DESYREL) 50 MG tablet Take 50 mg by mouth at bedtime.  . vitamin C (ASCORBIC ACID) 500 MG tablet Take 500 mg by mouth daily.  Marland Kitchen warfarin (COUMADIN) 2 MG tablet Take 2 mg by mouth daily. As Directed. Currently taking 85m/3mg  . warfarin (COUMADIN) 3 MG tablet Take 3 mg by mouth daily. As Directed. Currently taking 222m3mg     Allergies:   Clindamycin; Epinephrine; Levofloxacin; Moxifloxacin; Penicillins; Pravastatin; Sulfa antibiotics; Clarithromycin; and Other   Social History   Socioeconomic History  . Marital status: Widowed    Spouse name: Not on file  . Number of children: Not on file  . Years of education: Not on file  . Highest education level: Not on file  Occupational History  . Not on file  Social Needs  . Financial resource strain: Not on file  . Food insecurity:    Worry: Not on file    Inability: Not on file  . Transportation needs:    Medical: Not on file    Non-medical: Not on file  Tobacco Use  . Smoking status: Never Smoker  . Smokeless tobacco: Never Used  Substance and Sexual Activity  . Alcohol use: No  . Drug use: No  . Sexual activity: Not on file  Lifestyle  . Physical activity:    Days per week: Not on file    Minutes per session: Not on file  . Stress: Not on file  Relationships  . Social connections:    Talks on phone: Not on file    Gets together: Not on file    Attends religious service: Not on file    Active member of club or organization: Not on file    Attends meetings of clubs or organizations: Not on file    Relationship  status: Not on file  Other Topics Concern  . Not on file  Social History Narrative  . Not on file     Family History: The patient's family history includes Osteoarthritis in her mother; Rheum arthritis in her mother; Stroke in her mother. ROS:   Please see the history of present illness.    All other systems reviewed and are negative.  EKGs/Labs/Other Studies Reviewed:    The following studies were reviewed today:  EKG:  EKG ordered today.  The ekg ordered today demonstrates sinus rhythm normal EKG  Recent Labs:  Ref Range & Units1m34moo 41mo36mo Digoxin, Serum0.5 - 0.9 ng/mL0.4Low  0.4Low    06/19/2017: ALT 17 01/01/2018: BUN 13; Creatinine, Ser 0.75; Magnesium 2.2; Potassium 4.3; Sodium 140  Recent Lipid Panel  Component Value Date/Time   CHOL 191 06/19/2017 0807   TRIG 87 06/19/2017 0807   HDL 67 06/19/2017 0807   CHOLHDL 2.9 06/19/2017 0807   LDLCALC 107 (H) 06/19/2017 0807    Physical Exam:    VS:  BP 114/72 (BP Location: Right Arm, Patient Position: Sitting, Cuff Size: Normal)   Pulse (!) 55   Ht 5' (1.524 m)   Wt 106 lb 3.2 oz (48.2 kg)   SpO2 98%   BMI 20.74 kg/m     Wt Readings from Last 3 Encounters:  02/09/18 106 lb 3.2 oz (48.2 kg)  07/04/17 109 lb (49.4 kg)  12/13/16 111 lb 12.8 oz (50.7 kg)     GEN:  Well nourished, well developed in no acute distress HEENT: Normal NECK: No JVD; No carotid bruits LYMPHATICS: No lymphadenopathy CARDIAC: RRR, no murmurs, rubs, gallops RESPIRATORY:  Clear to auscultation without rales, wheezing or rhonchi  ABDOMEN: Soft, non-tender, non-distended MUSCULOSKELETAL:  No edema; No deformity  SKIN: Warm and dry NEUROLOGIC:  Alert and oriented x 3 PSYCHIATRIC:  Normal affect    Signed, Shirlee More, MD  02/09/2018 12:34 PM    [place]

## 2018-02-09 ENCOUNTER — Ambulatory Visit: Payer: Medicare Other | Admitting: Cardiology

## 2018-02-09 ENCOUNTER — Encounter: Payer: Self-pay | Admitting: Cardiology

## 2018-02-09 VITALS — BP 114/72 | HR 55 | Ht 60.0 in | Wt 106.2 lb

## 2018-02-09 DIAGNOSIS — E782 Mixed hyperlipidemia: Secondary | ICD-10-CM

## 2018-02-09 DIAGNOSIS — I480 Paroxysmal atrial fibrillation: Secondary | ICD-10-CM | POA: Diagnosis not present

## 2018-02-09 DIAGNOSIS — I119 Hypertensive heart disease without heart failure: Secondary | ICD-10-CM | POA: Diagnosis not present

## 2018-02-09 DIAGNOSIS — Z79899 Other long term (current) drug therapy: Secondary | ICD-10-CM | POA: Diagnosis not present

## 2018-02-09 NOTE — Patient Instructions (Signed)
Medication Instructions:  Your physician recommends that you continue on your current medications as directed. Please refer to the Current Medication list given to you today.   Labwork: Your physician recommends that you return for lab work today: CMP, lipid panel.   Testing/Procedures: You had an EKG today.   Follow-Up: Your physician wants you to follow-up in: 6 months. You will receive a reminder letter in the mail two months in advance. If you don't receive a letter, please call our office to schedule the follow-up appointment.   If you need a refill on your cardiac medications before your next appointment, please call your pharmacy.   Thank you for choosing [facility]! Catherine Lockhart, RN [phone]    

## 2018-02-10 LAB — COMPREHENSIVE METABOLIC PANEL
A/G RATIO: 1.8 (ref 1.2–2.2)
ALT: 18 IU/L (ref 0–32)
AST: 19 IU/L (ref 0–40)
Albumin: 4.1 g/dL (ref 3.5–4.8)
Alkaline Phosphatase: 60 IU/L (ref 39–117)
BUN/Creatinine Ratio: 20 (ref 12–28)
BUN: 14 mg/dL (ref 8–27)
Bilirubin Total: <0.2 mg/dL (ref 0.0–1.2)
CALCIUM: 9.2 mg/dL (ref 8.7–10.3)
CO2: 27 mmol/L (ref 20–29)
Chloride: 101 mmol/L (ref 96–106)
Creatinine, Ser: 0.7 mg/dL (ref 0.57–1.00)
GFR calc Af Amer: 99 mL/min/{1.73_m2} (ref >59)
GFR, EST NON AFRICAN AMERICAN: 86 mL/min/{1.73_m2} (ref >59)
GLUCOSE: 74 mg/dL (ref 65–99)
Globulin, Total: 2.3 g/dL (ref 1.5–4.5)
POTASSIUM: 4.4 mmol/L (ref 3.5–5.2)
Sodium: 141 mmol/L (ref 134–144)
Total Protein: 6.4 g/dL (ref 6.0–8.5)

## 2018-02-10 LAB — LIPID PANEL
CHOL/HDL RATIO: 2.7 ratio (ref 0.0–4.4)
Cholesterol, Total: 203 mg/dL — ABNORMAL HIGH (ref 100–199)
HDL: 75 mg/dL (ref >39)
LDL CALC: 98 mg/dL (ref 0–99)
TRIGLYCERIDES: 148 mg/dL (ref 0–149)
VLDL Cholesterol Cal: 30 mg/dL (ref 5–40)

## 2018-03-26 ENCOUNTER — Other Ambulatory Visit: Payer: Self-pay

## 2018-03-26 MED ORDER — POTASSIUM CHLORIDE CRYS ER 20 MEQ PO TBCR: 20.0000 meq | EXTENDED_RELEASE_TABLET | Freq: Two times a day (BID) | ORAL | 1 refills | Status: DC

## 2018-04-02 ENCOUNTER — Telehealth: Payer: Self-pay | Admitting: *Deleted

## 2018-04-02 MED ORDER — CANDESARTAN CILEXETIL 32 MG PO TABS: 16.0000 mg | ORAL_TABLET | Freq: Every day | ORAL | 3 refills | Status: DC

## 2018-04-02 NOTE — Telephone Encounter (Signed)
*  STAT* If patient is at the pharmacy, call can be transferred to refill team.   1. Which medications need to be refilled? (please list name of each medication and dose if known) Candesartan 32 mg, 0.5 tabs qd  2. Which pharmacy/location (including street and city if local pharmacy) is medication to be sent to?[facility] in [place]  3. Do they need a 30 day or 90 day supply? 90 (45)

## 2018-06-29 ENCOUNTER — Other Ambulatory Visit: Payer: Self-pay

## 2018-06-29 MED ORDER — DIGOX 125 MCG PO TABS: 125.0000 ug | ORAL_TABLET | ORAL | 3 refills | Status: DC

## 2018-08-15 ENCOUNTER — Telehealth: Payer: Self-pay | Admitting: Cardiology

## 2018-08-15 NOTE — Telephone Encounter (Signed)
Cardiac Questionnaire:    Since your last visit or hospitalization:    1. Have you been having new or worsening chest pain? no   2. Have you been having new or worsening shortness of breath? no 3. Have you been having new or worsening leg swelling, wt gain, or increase in abdominal girth (pants fitting more tightly)? no   4. Have you had any passing out spells? no   *A YES to any of these questions would result in the appointment being kept. *If all the answers to these questions are NO, we should indicate that given the current situation regarding the worldwide coronarvirus pandemic, at the recommendation of the CDC, we are looking to limit gatherings in our waiting area, and thus will reschedule their appointment beyond four weeks from today.   _____________   COVID-19 Pre-Screening Questions:   Do you currently have a fever? o (yes = cancel and refer to pcp for e-visit)no  Have you recently travelled on a cruise, internationally, or to [place], [place], [place], [place], [place], or [place], [place]) ? no (yes = cancel, stay home, monitor symptoms, and contact pcp or initiate e-visit if symptoms develop)  Have you been in contact with someone that is currently pending confirmation of Covid19 testing or has been confirmed to have the Covid19 virus?  no (yes = cancel, stay home, away from tested individual, monitor symptoms, and contact pcp or initiate e-visit if symptoms develop)  Are you currently experiencing fatigue or cough? no (yes = pt should be prepared to have a mask placed at the time of their visit).         Virtual Visit Pre-Appointment Phone Call  Steps For Call:  1. Confirm consent - "In the setting of the current Covid19 crisis, you are scheduled for a (phone or video) visit with your provider on (date) at (time).  Just as we do with many in-office visits, in order for you to participate in this visit, we must obtain consent.  If you'd like, I can send this to your mychart (if  signed up) or email for you to review.  Otherwise, I can obtain your verbal consent now.  All virtual visits are billed to your insurance company just like a normal visit would be.  By agreeing to a virtual visit, we'd like you to understand that the technology does not allow for your provider to perform an examination, and thus may limit your provider's ability to fully assess your condition.  Finally, though the technology is pretty good, we cannot assure that it will always work on either your or our end, and in the setting of a video visit, we may have to convert it to a phone-only visit.  In either situation, we cannot ensure that we have a secure connection.  Are you willing to proceed?"  2. Give patient instructions for WebEx download to smartphone as below if video visit  3. Advise patient to be prepared with any vital sign or heart rhythm information, their current medicines, and a piece of paper and pen handy for any instructions they may receive the day of their visit  4. Inform patient they will receive a phone call 15 minutes prior to their appointment time (may be from unknown caller ID) so they should be prepared to answer  5. Confirm that appointment type is correct in Epic appointment notes (video vs telephone)    TELEPHONE CALL NOTE  Leah Simmons has been deemed a candidate for a  follow-up tele-health visit to limit community exposure during the Covid-19 pandemic. I spoke with the patient via phone to ensure availability of phone/video source, confirm preferred email & phone number, and discuss instructions and expectations.  I reminded Leah Simmons to be prepared with any vital sign and/or heart rhythm information that could potentially be obtained via home monitoring, at the time of her visit. I reminded Leah Simmons to expect a phone call at the time of her visit if her visit.  Did the patient verbally acknowledge consent to treatment? YES/verbal   [name] 08/15/2018 1:51 PM   DOWNLOADING THE WEBEX SOFTWARE TO SMARTPHONE  - If Apple, go to [employer] and type in WebEx in the search bar. Download [employer], the blue/green circle. The app is free but as with any other app downloads, their phone may require them to verify saved payment information or Apple password. The patient does NOT have to create an account.  - If Android, ask patient to go to [employer] and type in WebEx in the search bar. Download [employer], the blue/green circle. The app is free but as with any other app downloads, their phone may require them to verify saved payment information or Android password. The patient does NOT have to create an account.   CONSENT FOR TELE-HEALTH VISIT - PLEASE REVIEW  I hereby voluntarily request, consent and authorize [facility] and its employed or contracted physicians, physician assistants, nurse practitioners or other licensed health care professionals (the Practitioner), to provide me with telemedicine health care services (the Services") as deemed necessary by the treating Practitioner. I acknowledge and consent to receive the Services by the Practitioner via telemedicine. I understand that the telemedicine visit will involve communicating with the Practitioner through live audiovisual communication technology and the disclosure of certain medical information by electronic transmission. I acknowledge that I have been given the opportunity to request an in-person assessment or other available alternative prior to the telemedicine visit and am voluntarily participating in the telemedicine visit.  I understand that I have the right to withhold or withdraw my consent to the use of telemedicine in the course of my care at any time, without affecting my right to future care or treatment, and that the Practitioner or I may terminate the telemedicine visit at any time. I understand that I have the right to inspect  all information obtained and/or recorded in the course of the telemedicine visit and may receive copies of available information for a reasonable fee.  I understand that some of the potential risks of receiving the Services via telemedicine include:   Delay or interruption in medical evaluation due to technological equipment failure or disruption;  Information transmitted may not be sufficient (e.g. poor resolution of images) to allow for appropriate medical decision making by the Practitioner; and/or   In rare instances, security protocols could fail, causing a breach of personal health information.  Furthermore, I acknowledge that it is my responsibility to provide information about my medical history, conditions and care that is complete and accurate to the best of my ability. I acknowledge that Practitioner's advice, recommendations, and/or decision may be based on factors not within their control, such as incomplete or inaccurate data provided by me or distortions of diagnostic images or specimens that may result from electronic transmissions. I understand that the practice of medicine is not an exact science and that Practitioner makes no warranties or guarantees regarding treatment outcomes. I  acknowledge that I will receive a copy of this consent concurrently upon execution via email to the email address I last provided but may also request a printed copy by calling the office of [place].    I understand that my insurance will be billed for this visit.   I have read or had this consent read to me.  I understand the contents of this consent, which adequately explains the benefits and risks of the Services being provided via telemedicine.   I have been provided ample opportunity to ask questions regarding this consent and the Services and have had my questions answered to my satisfaction.  I give my informed consent for the services to be provided through the use of telemedicine in my  medical care  By participating in this telemedicine visit I agree to the above.

## 2018-08-22 ENCOUNTER — Telehealth: Payer: Self-pay | Admitting: Cardiology

## 2018-08-22 ENCOUNTER — Other Ambulatory Visit: Payer: Self-pay

## 2018-08-22 ENCOUNTER — Encounter: Payer: Self-pay | Admitting: Cardiology

## 2018-08-22 ENCOUNTER — Telehealth (INDEPENDENT_AMBULATORY_CARE_PROVIDER_SITE_OTHER): Payer: Medicare Other | Admitting: Cardiology

## 2018-08-22 VITALS — BP 125/60 | Ht 60.0 in | Wt 107.0 lb

## 2018-08-22 DIAGNOSIS — Z7189 Other specified counseling: Secondary | ICD-10-CM

## 2018-08-22 DIAGNOSIS — Z7901 Long term (current) use of anticoagulants: Secondary | ICD-10-CM

## 2018-08-22 DIAGNOSIS — Z79899 Other long term (current) drug therapy: Secondary | ICD-10-CM

## 2018-08-22 DIAGNOSIS — E782 Mixed hyperlipidemia: Secondary | ICD-10-CM

## 2018-08-22 DIAGNOSIS — I119 Hypertensive heart disease without heart failure: Secondary | ICD-10-CM

## 2018-08-22 DIAGNOSIS — I480 Paroxysmal atrial fibrillation: Secondary | ICD-10-CM | POA: Diagnosis not present

## 2018-08-22 NOTE — Progress Notes (Signed)
Virtual Visit via Video Note   This visit type was conducted due to national recommendations for restrictions regarding the COVID-19 Pandemic (e.g. social distancing) in an effort to limit this patient's exposure and mitigate transmission in our community.  Due to her co-morbid illnesses, this patient is at least at moderate risk for complications without adequate follow up.  This format is felt to be most appropriate for this patient at this time.  All issues noted in this document were discussed and addressed.  A limited physical exam was performed with this format.  Please refer to the patient's chart for her consent to telehealth for [facility].   Evaluation Performed:  Follow-up visit  Date:  08/22/2018   ID:  Leah Simmons, DOB 09-Oct-1945, MRN [id number]  Patient Location: Home Provider Location: Office  PCP:  Olive Bass, MD  Cardiologist:  No primary care provider on file. Dr Dulce Sellar Electrophysiologist:  None   Chief Complaint:  Atrial fibrillation  History of Present Illness:    Leah Simmons is a 73 y.o. female with a hx of atrial fibrillation on digoxin, hyperlipidemia, hypertension and anticoagulation last seen 02/09/18.  Overall she feels well she is isolated in her home practices social distance wears a mask outdoors and good handwashing and sanitation technique.  She has had minimal palpitation not frequent severe sustained tolerates digoxin without GI symptoms and has not had syncope chest pain edema shortness of breath bleeding from her anticoagulant or TIA.  INR is managed with her PCP Dr. Sol Passer I told her she be due in 2 weeks to repeat I will ask his office to do her full labs including a CMP lipid profile and digoxin level.  The patient does not have symptoms concerning for COVID-19 infection (fever, chills, cough, or new shortness of breath).    Past Medical History:  Diagnosis Date  . Allergic rhinitis 09/06/2015  . Anxiety 09/06/2015  .  Atrophic vaginitis 02/08/2016  . Benign hypertension 09/06/2015  . Chronic atrial fibrillation 07/27/2015   Overview:  2003: dx  . Chronic rheumatic arthritis ([facility]) 02/08/2016  . Congestive heart failure (CHF) ([facility]) 04/19/2016  . GERD (gastroesophageal reflux disease) 02/08/2016  . Long term current use of anticoagulant therapy 07/27/2015  . Mixed hyperlipidemia 11/07/2014  . Osteoporosis 02/08/2016   Overview:  2002: -2.0 2012: -1.4 2014: -1.9 2017: -1.9 2017: fx humerus  . Screening for breast cancer 10/19/2016  . Screening for cervical cancer 10/19/2016  . Screening for colon cancer 10/19/2016  . Screening for diabetes mellitus (DM) 10/19/2016  . Sick sinus syndrome ([facility]) 09/06/2015  . TIA (transient ischemic attack) 09/06/2015   Overview:  2003: right face sensation changes  . Wellness examination 10/19/2016   Past Surgical History:  Procedure Laterality Date  . LAPAROSCOPIC CHOLECYSTECTOMY    . TONSILLECTOMY       No outpatient medications have been marked as taking for the 08/22/18 encounter (Appointment) with Baldo Daub, MD.     Allergies:   Clindamycin; Epinephrine; Levofloxacin; Moxifloxacin; Penicillins; Pravastatin; Sulfa antibiotics; Clarithromycin; and Other   Social History   Tobacco Use  . Smoking status: Never Smoker  . Smokeless tobacco: Never Used  Substance Use Topics  . Alcohol use: No  . Drug use: No     Family Hx: The patient's family history includes Osteoarthritis in her mother; Rheum arthritis in her mother; Stroke in her mother.  ROS:   Please see the history of present illness.     All  other systems reviewed and are negative.   Prior CV studies:   The following studies were reviewed today:    Labs/Other Tests and Data Reviewed:    EKG:  An ECG dated 02/09/18 was personally reviewed today and demonstrated:  sinus bradycardia 54 BPM  lAE ownormal  Recent Labs:  Component Name 07/25/2018 05/29/2018 05/17/2018 04/25/2018 04/03/2018 02/27/2018  02/13/2018 01/30/2018 11/24/2017 09/19/2017 07/17/2017 05/18/2017 03/22/2017 01/18/2017 11/14/2016 10/11/2016 09/21/2016 07/20/2016 05/10/2016 04/07/2016 03/29/2016 03/15/2016 01/12/2016 11/11/2015 09/30/2015 09/21/2015 09/07/2015 08/04/2015 07/27/2015 05/25/2015 03/12/2015 12/30/2014 10/20/2014 08/11/2014 07/15/2014 07/07/2014 05/06/2014 03/11/2014 01/03/2014 12/10/2013 09/16/2013 08/13/2013 06/18/2013 05/07/2013 03/25/2013 03/18/2013 03/04/2013 02/21/2013   1.90  3.00 1.90 2.00     01/01/2018: Magnesium 2.2 02/09/2018: ALT 18; BUN 14; Creatinine, Ser 0.70; Potassium 4.4; Sodium 141   Recent Lipid Panel Lab Results  Component Value Date/Time   CHOL 203 (H) 02/09/2018 12:07 PM   TRIG 148 02/09/2018 12:07 PM   HDL 75 02/09/2018 12:07 PM   CHOLHDL 2.7 02/09/2018 12:07 PM   LDLCALC 98 02/09/2018 12:07 PM    Wt Readings from Last 3 Encounters:  02/09/18 106 lb 3.2 oz (48.2 kg)  07/04/17 109 lb (49.4 kg)  12/13/16 111 lb 12.8 oz (50.7 kg)     Objective:    Vital Signs:  There were no vitals taken for this visit.   Well nourished, well developed female in no acute distress. Constitutional, well-nourished well-developed in no acute distress Vital signs reviewed Eyes, conjunctiva and sclera are normal without pallor or icterus extraocular motions intact and normal there is no lid lag Respiratory, normal effort and excursion no audible wheezing without a stethoscope Cardiovascular, no neck vein distention or peripheral edema Skin, no rash skin lesion or ulceration of the extremities Neurologic, cranial nerves II to XII are grossly intact and the patient moves all 4 extremities Neuro/Psychiatric, judgment and thought processes are intact and coherent, alert and oriented x3, mood and affect appear normal.  ASSESSMENT & PLAN:    1. Her atrial fibrillation is well controlled she follows heart rate and blood pressure at home she has had no alert in the digital device and no change in her heart rates.  She will continue her low-dose  digoxin and warfarin goal INR 2.5 2. High risk medication, digoxin no evidence of toxicity I will ask her PCP to draw level goal less than 1 to avoid the excess mortality with atrial fibrillation. 3. Anticoagulant therapy, stable continue warfarin goal INR 2.5 4. Hypertension stable continue ARB which is been quite effective, BP is at target continue home monitoring 5. Recheck lipids at this time is not on lipid-lowering therapy  COVID-19 Education: The signs and symptoms of COVID-19 were discussed with the patient and how to seek care for testing (follow up with PCP or arrange E-visit).  The importance of social distancing was discussed today.  Time:   Today, I have spent 25 minutes with the patient with telehealth technology discussing the above problems.     Medication Adjustments/Labs and Tests Ordered: Current medicines are reviewed at length with the patient today.  Concerns regarding medicines are outlined above.   Tests Ordered: No orders of the defined types were placed in this encounter.   Medication Changes: No orders of the defined types were placed in this encounter.   Disposition:  Follow up in 6 month(s)  Signed, Norman Herrlich, MD  08/22/2018 7:41 AM    [facility]

## 2018-08-22 NOTE — Telephone Encounter (Signed)
YOUR CARDIOLOGY TEAM HAS ARRANGED FOR AN E-VISIT FOR YOUR APPOINTMENT - PLEASE REVIEW IMPORTANT INFORMATION BELOW SEVERAL DAYS PRIOR TO YOUR APPOINTMENT  Due to the recent COVID-19 pandemic, we are transitioning in-person office visits to tele-medicine visits in an effort to decrease unnecessary exposure to our patients, their families, and staff. Medicare and most insurances are covering these visits without a copay needed. We also encourage you to sign up for MyChart if you have not already done so. You will need a smartphone if possible. For patients that do not have this, we can still complete the visit using a regular telephone but do prefer a smartphone to enable video when possible. You may have a family member that lives with you that can help. If possible, we also ask that you have a blood pressure cuff and scale at home to measure your blood pressure, heart rate and weight prior to your scheduled appointment. Patients with clinical needs that need an in-person evaluation and testing will still be able to come to the office if absolutely necessary. If you have any questions, feel free to call our office.    CONSENT FOR TELE-HEALTH VISIT - PLEASE REVIEW  I hereby voluntarily request, consent and authorize [facility] and its employed or contracted physicians, physician assistants, nurse practitioners or other licensed health care professionals (the Practitioner), to provide me with telemedicine health care services (the Services") as deemed necessary by the treating Practitioner. I acknowledge and consent to receive the Services by the Practitioner via telemedicine. I understand that the telemedicine visit will involve communicating with the Practitioner through live audiovisual communication technology and the disclosure of certain medical information by electronic transmission. I acknowledge that I have been given the opportunity to request an in-person assessment or other available alternative  prior to the telemedicine visit and am voluntarily participating in the telemedicine visit.  I understand that I have the right to withhold or withdraw my consent to the use of telemedicine in the course of my care at any time, without affecting my right to future care or treatment, and that the Practitioner or I may terminate the telemedicine visit at any time. I understand that I have the right to inspect all information obtained and/or recorded in the course of the telemedicine visit and may receive copies of available information for a reasonable fee.  I understand that some of the potential risks of receiving the Services via telemedicine include:   Delay or interruption in medical evaluation due to technological equipment failure or disruption;  Information transmitted may not be sufficient (e.g. poor resolution of images) to allow for appropriate medical decision making by the Practitioner; and/or   In rare instances, security protocols could fail, causing a breach of personal health information.  Furthermore, I acknowledge that it is my responsibility to provide information about my medical history, conditions and care that is complete and accurate to the best of my ability. I acknowledge that Practitioner's advice, recommendations, and/or decision may be based on factors not within their control, such as incomplete or inaccurate data provided by me or distortions of diagnostic images or specimens that may result from electronic transmissions. I understand that the practice of medicine is not an exact science and that Practitioner makes no warranties or guarantees regarding treatment outcomes. I acknowledge that I will receive a copy of this consent concurrently upon execution via email to the email address I last provided but may also request a printed copy by calling the office of [facility].    I understand that my insurance will be billed for this visit.   I have read or had this  consent read to me.  I understand the contents of this consent, which adequately explains the benefits and risks of the Services being provided via telemedicine.   I have been provided ample opportunity to ask questions regarding this consent and the Services and have had my questions answered to my satisfaction.  I give my informed consent for the services to be provided through the use of telemedicine in my medical care  By participating in this telemedicine visit I agree to the above.  Patient gives verbal consent pp

## 2018-08-22 NOTE — Patient Instructions (Signed)

## 2018-08-23 NOTE — Telephone Encounter (Signed)
error 

## 2018-09-26 ENCOUNTER — Other Ambulatory Visit: Payer: Self-pay | Admitting: Family Medicine

## 2018-09-26 DIAGNOSIS — Z1231 Encounter for screening mammogram for malignant neoplasm of breast: Secondary | ICD-10-CM

## 2018-10-03 ENCOUNTER — Other Ambulatory Visit: Payer: Self-pay | Admitting: Cardiology

## 2018-11-05 ENCOUNTER — Encounter: Payer: Self-pay | Admitting: Cardiology

## 2018-11-26 ENCOUNTER — Other Ambulatory Visit: Payer: Self-pay

## 2018-11-26 ENCOUNTER — Ambulatory Visit
Admission: RE | Admit: 2018-11-26 | Discharge: 2018-11-26 | Disposition: A | Discharge status: Home / Self Care | Payer: Medicare Other | Source: Ambulatory Visit | Attending: Family Medicine | Admitting: Family Medicine

## 2018-11-26 DIAGNOSIS — Z1231 Encounter for screening mammogram for malignant neoplasm of breast: Secondary | ICD-10-CM

## 2019-02-06 ENCOUNTER — Telehealth: Payer: Medicare Other | Admitting: Cardiology

## 2019-03-10 NOTE — Progress Notes (Signed)
Cardiology Office Note:    Date:  03/12/2019   ID:  Leah Simmons, DOB October 07, 1945, MRN [id number]  PCP:  Olive Bass, MD  Cardiologist:  Norman Herrlich, MD    Referring MD: Olive Bass, MD    ASSESSMENT:    1. Paroxysmal atrial fibrillation (HCC)   2. Long term current use of anticoagulant therapy   3. High risk medication use   4. Hypertensive heart disease without heart failure    PLAN:    In order of problems listed above:  1. Stable maintaining sinus rhythm interestingly recent literature from the [employer] Society of cardiology now embraces atrial fibrillation has beneficial provided levels are Less than 1.  Check digoxin level continue warfarin 2. Continue digoxin no evidence of toxicity Stable Home blood pressures run at target less than 130/80.  She asked me if she has heart failure told her no and will removed from her list as she has hypertensive heart disease without heart failure Next appointment: 6 months   Medication Adjustments/Labs and Tests Ordered: Current medicines are reviewed at length with the patient today.  Concerns regarding medicines are outlined above.  No orders of the defined types were placed in this encounter.  No orders of the defined types were placed in this encounter.   Chief Complaint  Patient presents with  . Follow-up  . Atrial Fibrillation    On digoxin  . Anticoagulation    With warfarin    History of Present Illness:    Leah Simmons is a 73 y.o. female with a hx of atrial fibrillation on digoxin, hyperlipidemia, hypertension and anticoagulation last seen 08/22/2018 virtual visit.  She is maintained on warfarin managed by her PCP office. Compliance with diet, lifestyle and medications: Yes  Overall doing well she is concerned about going from tradename Coumadin to generic and I asked her to speak with her PCP about using branded generic Jantoven.  No bleeding complication palpitation shortness of breath chest  pain syncope or TIA.  She has no GI symptoms to suggest digoxin toxicity and will do labs today including lipid profile CMP and digoxin level.  I reinforced the goal INR of 2-3. Past Medical History:  Diagnosis Date  . Allergic rhinitis 09/06/2015  . Anxiety 09/06/2015  . Atrophic vaginitis 02/08/2016  . Benign hypertension 09/06/2015  . Chronic atrial fibrillation (HCC) 07/27/2015   Overview:  2003: dx  . Chronic rheumatic arthritis (HCC) 02/08/2016  . Congestive heart failure (CHF) (HCC) 04/19/2016  . GERD (gastroesophageal reflux disease) 02/08/2016  . Long term current use of anticoagulant therapy 07/27/2015  . Mixed hyperlipidemia 11/07/2014  . Osteoporosis 02/08/2016   Overview:  2002: -2.0 2012: -1.4 2014: -1.9 2017: -1.9 2017: fx humerus  . Screening for breast cancer 10/19/2016  . Screening for cervical cancer 10/19/2016  . Screening for colon cancer 10/19/2016  . Screening for diabetes mellitus (DM) 10/19/2016  . Sick sinus syndrome (HCC) 09/06/2015  . TIA (transient ischemic attack) 09/06/2015   Overview:  2003: right face sensation changes  . Wellness examination 10/19/2016    Past Surgical History:  Procedure Laterality Date  . LAPAROSCOPIC CHOLECYSTECTOMY    . TONSILLECTOMY      Current Medications: Current Meds  Medication Sig  . B Complex Vitamins (VITAMIN B COMPLEX PO) Take 1 tablet by mouth daily.  . Calcium Carb-Cholecalciferol (CALCIUM-VITAMIN D) 500-200 MG-UNIT tablet Take 1 tablet by mouth daily.  . candesartan (ATACAND) 32 MG tablet Take 0.5 tablets (16 mg total)  by mouth daily.  . Cholecalciferol (VITAMIN D3) 2000 units capsule Take 2,000 Units by mouth daily.  Marland Kitchen conjugated estrogens (PREMARIN) vaginal cream Place vaginally. Insert 0.5 g into the vagina two times per week  . cycloSPORINE (RESTASIS) 0.05 % ophthalmic emulsion Place 1 drop into both eyes daily.  Marland Kitchen DIGOX 125 MCG tablet Take 1 tablet (125 mcg total) by mouth every other day.  . loratadine (CLARITIN) 10 MG  tablet Take 10 mg by mouth daily.  . Magnesium Oxide 140 MG CAPS Take 140 mg by mouth daily.  . mometasone (NASONEX) 50 MCG/ACT nasal spray Place 2 sprays into the nose daily.  Marland Kitchen omeprazole (PRILOSEC) 20 MG capsule Take 20 mg by mouth daily as needed for indigestion.  Marland Kitchen PARoxetine (PAXIL) 10 MG tablet Take 10 mg by mouth daily.  . potassium chloride SA (K-DUR) 20 MEQ tablet TAKE 1 TABLET(20 MEQ) BY MOUTH TWICE DAILY  . traZODone (DESYREL) 50 MG tablet Take 50 mg by mouth at bedtime.  . vitamin C (ASCORBIC ACID) 500 MG tablet Take 500 mg by mouth daily.  Marland Kitchen warfarin (COUMADIN) 2 MG tablet Take 2 mg by mouth daily. As Directed. Currently taking 3mg /3mg /3mg /1.5mg   . warfarin (COUMADIN) 3 MG tablet Take 3 mg by mouth daily. As Directed. Currently taking 3mg /3mg /3mg /1.5mg      Allergies:   Clindamycin, Epinephrine, Levofloxacin, Moxifloxacin, Penicillins, Pravastatin, Sulfa antibiotics, Clarithromycin, and Other   Social History   Socioeconomic History  . Marital status: Widowed    Spouse name: Not on file  . Number of children: Not on file  . Years of education: Not on file  . Highest education level: Not on file  Occupational History  . Not on file  Social Needs  . Financial resource strain: Not on file  . Food insecurity    Worry: Not on file    Inability: Not on file  . Transportation needs    Medical: Not on file    Non-medical: Not on file  Tobacco Use  . Smoking status: Never Smoker  . Smokeless tobacco: Never Used  Substance and Sexual Activity  . Alcohol use: No  . Drug use: No  . Sexual activity: Not on file  Lifestyle  . Physical activity    Days per week: Not on file    Minutes per session: Not on file  . Stress: Not on file  Relationships  . Social Herbalist on phone: Not on file    Gets together: Not on file    Attends religious service: Not on file    Active member of club or organization: Not on file    Attends meetings of clubs or organizations:  Not on file    Relationship status: Not on file  Other Topics Concern  . Not on file  Social History Narrative  . Not on file     Family History: The patient's family history includes Osteoarthritis in her mother; Rheum arthritis in her mother; Stroke in her mother. ROS:   Please see the history of present illness.    All other systems reviewed and are negative.  EKGs/Labs/Other Studies Reviewed:    The following studies were reviewed today:  EKG:  EKG ordered today and personally reviewed.  The ekg ordered today demonstrates sinus rhythm and is normal  Recent Labs: Her most recent INR 01/18/1999 22.9 and she has been in range since April.  2020 09/05/2018: Cholesterol 202 triglycerides 105 HDL 66 LDL cholesterol 116 BMP normal potassium 4.0  creatinine 0.74. Digoxin level 0.6 Recent Lipid Panel    Component Value Date/Time   CHOL 203 (H) 02/09/2018 1207   TRIG 148 02/09/2018 1207   HDL 75 02/09/2018 1207   CHOLHDL 2.7 02/09/2018 1207   LDLCALC 98 02/09/2018 1207    Physical Exam:    VS:  BP (!) 148/84 (BP Location: Right Arm, Patient Position: Sitting, Cuff Size: Normal)   Pulse (!) 56   Ht 5' 1.5" (1.562 m)   Wt 107 lb 3.2 oz (48.6 kg)   SpO2 93%   BMI 19.93 kg/m     Wt Readings from Last 3 Encounters:  03/12/19 107 lb 3.2 oz (48.6 kg)  08/22/18 107 lb (48.5 kg)  02/09/18 106 lb 3.2 oz (48.2 kg)     GEN:  Well nourished, well developed in no acute distress HEENT: Normal NECK: No JVD; No carotid bruits LYMPHATICS: No lymphadenopathy CARDIAC: RRR, no murmurs, rubs, gallops RESPIRATORY:  Clear to auscultation without rales, wheezing or rhonchi  ABDOMEN: Soft, non-tender, non-distended MUSCULOSKELETAL:  No edema; No deformity  SKIN: Warm and dry NEUROLOGIC:  Alert and oriented x 3 PSYCHIATRIC:  Normal affect    Signed, Norman HerrlichBrian , MD  03/12/2019 3:49 PM    [facility]

## 2019-03-12 ENCOUNTER — Other Ambulatory Visit: Payer: Self-pay

## 2019-03-12 ENCOUNTER — Encounter: Payer: Self-pay | Admitting: Cardiology

## 2019-03-12 ENCOUNTER — Ambulatory Visit (INDEPENDENT_AMBULATORY_CARE_PROVIDER_SITE_OTHER): Payer: Medicare Other | Admitting: Cardiology

## 2019-03-12 VITALS — BP 148/84 | HR 56 | Ht 61.5 in | Wt 107.2 lb

## 2019-03-12 DIAGNOSIS — Z79899 Other long term (current) drug therapy: Secondary | ICD-10-CM | POA: Diagnosis not present

## 2019-03-12 DIAGNOSIS — F419 Anxiety disorder, unspecified: Secondary | ICD-10-CM

## 2019-03-12 DIAGNOSIS — Z7901 Long term (current) use of anticoagulants: Secondary | ICD-10-CM | POA: Diagnosis not present

## 2019-03-12 DIAGNOSIS — I480 Paroxysmal atrial fibrillation: Secondary | ICD-10-CM | POA: Diagnosis not present

## 2019-03-12 DIAGNOSIS — Z5181 Encounter for therapeutic drug level monitoring: Secondary | ICD-10-CM

## 2019-03-12 DIAGNOSIS — I119 Hypertensive heart disease without heart failure: Secondary | ICD-10-CM

## 2019-03-12 DIAGNOSIS — E782 Mixed hyperlipidemia: Secondary | ICD-10-CM

## 2019-03-12 NOTE — Patient Instructions (Addendum)
Medication Instructions:  Your physician recommends that you continue on your current medications as directed. Please refer to the Current Medication list given to you today.  *If you need a refill on your cardiac medications before your next appointment, please call your pharmacy*  Lab Work: Your physician recommends that you return for lab work today: CMP, lipid panel, digoxin level.  If you have labs (blood work) drawn today and your tests are completely normal, you will receive your results only by: Marland Kitchen MyChart Message (if you have MyChart) OR . A paper copy in the mail If you have any lab test that is abnormal or we need to change your treatment, we will call you to review the results.  Testing/Procedures: You had an EKG today.    Follow-Up: At [facility], you and your health needs are our priority.  As part of our continuing mission to provide you with exceptional heart care, we have created designated Provider Care Teams.  These Care Teams include your primary Cardiologist (physician) and Advanced Practice Providers (APPs -  Physician Assistants and Nurse Practitioners) who all work together to provide you with the care you need, when you need it.  Your next appointment:   6 months  The format for your next appointment:   In Person  Provider:   Shirlee More, MD

## 2019-03-13 LAB — COMPREHENSIVE METABOLIC PANEL
ALT: 17 IU/L (ref 0–32)
AST: 25 IU/L (ref 0–40)
Albumin/Globulin Ratio: 2.2 (ref 1.2–2.2)
Albumin: 4.7 g/dL (ref 3.7–4.7)
Alkaline Phosphatase: 56 IU/L (ref 39–117)
BUN/Creatinine Ratio: 19 (ref 12–28)
BUN: 13 mg/dL (ref 8–27)
Bilirubin Total: 0.5 mg/dL (ref 0.0–1.2)
CO2: 24 mmol/L (ref 20–29)
Calcium: 9.5 mg/dL (ref 8.7–10.3)
Chloride: 102 mmol/L (ref 96–106)
Creatinine, Ser: 0.68 mg/dL (ref 0.57–1.00)
GFR calc Af Amer: 100 mL/min/{1.73_m2} (ref >59)
GFR calc non Af Amer: 86 mL/min/{1.73_m2} (ref >59)
Globulin, Total: 2.1 g/dL (ref 1.5–4.5)
Glucose: 86 mg/dL (ref 65–99)
Potassium: 4.5 mmol/L (ref 3.5–5.2)
Sodium: 142 mmol/L (ref 134–144)
Total Protein: 6.8 g/dL (ref 6.0–8.5)

## 2019-03-13 LAB — LIPID PANEL
Chol/HDL Ratio: 3 ratio (ref 0.0–4.4)
Cholesterol, Total: 220 mg/dL — ABNORMAL HIGH (ref 100–199)
HDL: 74 mg/dL (ref >39)
LDL Chol Calc (NIH): 132 mg/dL — ABNORMAL HIGH (ref 0–99)
Triglycerides: 82 mg/dL (ref 0–149)
VLDL Cholesterol Cal: 14 mg/dL (ref 5–40)

## 2019-03-13 LAB — DIGOXIN LEVEL: Digoxin, Serum: 0.5 ng/mL (ref 0.5–0.9)

## 2019-04-02 ENCOUNTER — Other Ambulatory Visit: Payer: Self-pay | Admitting: Cardiology

## 2019-04-18 ENCOUNTER — Other Ambulatory Visit: Payer: Self-pay | Admitting: Cardiology

## 2019-06-27 ENCOUNTER — Other Ambulatory Visit: Payer: Self-pay

## 2019-06-27 MED ORDER — DIGOX 125 MCG PO TABS: 125.0000 ug | ORAL_TABLET | ORAL | 3 refills | Status: DC

## 2019-06-28 ENCOUNTER — Other Ambulatory Visit: Payer: Self-pay

## 2019-09-12 ENCOUNTER — Telehealth: Payer: Self-pay | Admitting: Cardiology

## 2019-09-12 DIAGNOSIS — E785 Hyperlipidemia, unspecified: Secondary | ICD-10-CM

## 2019-09-12 DIAGNOSIS — I119 Hypertensive heart disease without heart failure: Secondary | ICD-10-CM

## 2019-09-12 DIAGNOSIS — Z79899 Other long term (current) drug therapy: Secondary | ICD-10-CM

## 2019-09-12 NOTE — Telephone Encounter (Signed)
Spoke with patient regarding recommendations.  Patient verbalizes understanding and is agreeable to plan of care. Advised patient to call back with any issues or concerns.  

## 2019-09-12 NOTE — Telephone Encounter (Signed)
Patient states she is requesting orders to have lab work completed prior to appointment scheduled for 10/17/19 with Dr. Dulce Sellar. Please assist.

## 2019-09-12 NOTE — Telephone Encounter (Signed)
CMP lipid profile digoxin level and magnesium.  Please do the digoxin level without taking the dose that day until after the test is performed

## 2019-10-10 ENCOUNTER — Telehealth: Payer: Self-pay

## 2019-10-10 NOTE — Telephone Encounter (Signed)
-----   Message from Thomasene Ripple, DO sent at 10/09/2019  4:58 PM EDT ----- Overall your blood work is good.  But the cholesterol did not come down from 7 months ago as well as the LDL is still 132.  At your upcoming visit we discussed in detail with Dr. Dulce Sellar.

## 2019-10-10 NOTE — Telephone Encounter (Signed)
Spoke with patient regarding results and recommendation.  Patient verbalizes understanding and is agreeable to plan of care. Advised patient to call back with any issues or concerns.  

## 2019-10-11 LAB — COMPREHENSIVE METABOLIC PANEL
ALT: 17 IU/L (ref 0–32)
AST: 24 IU/L (ref 0–40)
Albumin/Globulin Ratio: 2 (ref 1.2–2.2)
Albumin: 4.3 g/dL (ref 3.7–4.7)
Alkaline Phosphatase: 58 IU/L (ref 48–121)
BUN/Creatinine Ratio: 17 (ref 12–28)
BUN: 13 mg/dL (ref 8–27)
Bilirubin Total: 0.5 mg/dL (ref 0.0–1.2)
CO2: 25 mmol/L (ref 20–29)
Calcium: 9.5 mg/dL (ref 8.7–10.3)
Chloride: 100 mmol/L (ref 96–106)
Creatinine, Ser: 0.76 mg/dL (ref 0.57–1.00)
GFR calc Af Amer: 89 mL/min/{1.73_m2} (ref >59)
GFR calc non Af Amer: 78 mL/min/{1.73_m2} (ref >59)
Globulin, Total: 2.1 g/dL (ref 1.5–4.5)
Glucose: 91 mg/dL (ref 65–99)
Potassium: 4.6 mmol/L (ref 3.5–5.2)
Sodium: 140 mmol/L (ref 134–144)
Total Protein: 6.4 g/dL (ref 6.0–8.5)

## 2019-10-11 LAB — LIPID PANEL
Chol/HDL Ratio: 2.9 ratio (ref 0.0–4.4)
Cholesterol, Total: 222 mg/dL — ABNORMAL HIGH (ref 100–199)
HDL: 76 mg/dL (ref >39)
LDL Chol Calc (NIH): 132 mg/dL — ABNORMAL HIGH (ref 0–99)
Triglycerides: 83 mg/dL (ref 0–149)
VLDL Cholesterol Cal: 14 mg/dL (ref 5–40)

## 2019-10-11 LAB — DIGOXIN LEVEL: Digoxin, Serum: 0.4 ng/mL — ABNORMAL LOW (ref 0.5–0.9)

## 2019-10-11 LAB — MAGNESIUM: Magnesium: 2.1 mg/dL (ref 1.6–2.3)

## 2019-10-16 NOTE — Progress Notes (Signed)
Cardiology Office Note:    Date:  10/17/2019   ID:  Leah Simmons, DOB 1946/02/10, MRN [id number]  PCP:  Olive Bass, MD  Cardiologist:  Norman Herrlich, MD    Referring MD: Olive Bass, MD    ASSESSMENT:    1. Paroxysmal atrial fibrillation (HCC)   2. Long term current use of anticoagulant therapy   3. High risk medication use   4. Hypertensive heart disease without heart failure   5. Hyperlipidemia, unspecified hyperlipidemia type    PLAN:    In order of problems listed above:  1. Stable maintaining sinus rhythm continue low-dose digoxin level is therapeutic at target less than 1 to avoid excess mortality when used with atrial fibrillation and continue her current warfarin managed with her PCP 2. BP at target continue her ARB with normal liver function She wants to get back to her exercise program and practice lifestyle modification.  We talked about a coronary calcium score for risk decision she thinks she had one performed in the past I reviewed her chart I cannot find and I think she is confusing this with a [facility] screening.  Next appointment: 6 months   Medication Adjustments/Labs and Tests Ordered: Current medicines are reviewed at length with the patient today.  Concerns regarding medicines are outlined above.  No orders of the defined types were placed in this encounter.  No orders of the defined types were placed in this encounter.   Chief Complaint  Patient presents with  . Follow-up    6 MO FU     History of Present Illness:    Leah Simmons is a 74 y.o. female with a hx of atrial fibrillation on digoxin, hyperlipidemia, hypertension and anticoagulation last seen 08/22/2018 virtual visit.  She is maintained on warfarin managed by her PCP  last seen 03/11/2020. Compliance with diet, lifestyle and medications: Yes  She has ongoing problems with back pain otherwise feels well has had no recurrent atrial fibrillation bleeding from her  anticoagulant chest pain or shortness of breath.  She has moderate dyslipidemia and has had adverse reactions from statins and at this time is made decision not to accept lipid-lowering therapy as she is in a nonhigh risk group.  She is having no symptoms of digitalis toxicity nausea or vomiting. Past Medical History:  Diagnosis Date  . Allergic rhinitis 09/06/2015  . Anxiety 09/06/2015  . Atrophic vaginitis 02/08/2016  . Benign hypertension 09/06/2015  . Chronic atrial fibrillation (HCC) 07/27/2015   Overview:  2003: dx  . Chronic rheumatic arthritis (HCC) 02/08/2016  . Congestive heart failure (CHF) (HCC) 04/19/2016  . GERD (gastroesophageal reflux disease) 02/08/2016  . Long term current use of anticoagulant therapy 07/27/2015  . Mixed hyperlipidemia 11/07/2014  . Osteoporosis 02/08/2016   Overview:  2002: -2.0 2012: -1.4 2014: -1.9 2017: -1.9 2017: fx humerus  . Screening for breast cancer 10/19/2016  . Screening for cervical cancer 10/19/2016  . Screening for colon cancer 10/19/2016  . Screening for diabetes mellitus (DM) 10/19/2016  . Sick sinus syndrome (HCC) 09/06/2015  . TIA (transient ischemic attack) 09/06/2015   Overview:  2003: right face sensation changes  . Wellness examination 10/19/2016    Past Surgical History:  Procedure Laterality Date  . LAPAROSCOPIC CHOLECYSTECTOMY    . TONSILLECTOMY      Current Medications: Current Meds  Medication Sig  . B Complex Vitamins (VITAMIN B COMPLEX PO) Take 1 tablet by mouth daily.  . Calcium Carb-Cholecalciferol (CALCIUM-VITAMIN D) 500-200  MG-UNIT tablet Take 1 tablet by mouth daily.  . candesartan (ATACAND) 32 MG tablet TAKE 1/2 TABLET(16 MG) BY MOUTH DAILY  . Cholecalciferol (VITAMIN D3) 2000 units capsule Take 2,000 Units by mouth daily.  Marland Kitchen conjugated estrogens (PREMARIN) vaginal cream Place vaginally. Insert 0.5 g into the vagina two times per week  . cycloSPORINE (RESTASIS) 0.05 % ophthalmic emulsion Place 1 drop into both eyes daily.    Marland Kitchen DIGOX 125 MCG tablet Take 1 tablet (125 mcg total) by mouth every other day.  . loratadine (CLARITIN) 10 MG tablet Take 10 mg by mouth daily.  . Magnesium Oxide 140 MG CAPS Take 140 mg by mouth daily.  . mometasone (NASONEX) 50 MCG/ACT nasal spray Place 2 sprays into the nose daily.  Marland Kitchen omeprazole (PRILOSEC) 20 MG capsule Take 20 mg by mouth daily as needed for indigestion.  Marland Kitchen PARoxetine (PAXIL) 10 MG tablet Take 10 mg by mouth daily.  . potassium chloride SA (KLOR-CON) 20 MEQ tablet TAKE 1 TABLET(20 MEQ) BY MOUTH TWICE DAILY  . traZODone (DESYREL) 50 MG tablet Take 50 mg by mouth at bedtime.  Marland Kitchen warfarin (JANTOVEN) 3 MG tablet 1 tablet for 3 days in a row then 1/2 tablet     Allergies:   Clindamycin, Epinephrine, Levofloxacin, Moxifloxacin, Penicillins, Pravastatin, Sulfa antibiotics, Clarithromycin, and Other   Social History   Socioeconomic History  . Marital status: Widowed    Spouse name: Not on file  . Number of children: Not on file  . Years of education: Not on file  . Highest education level: Not on file  Occupational History  . Not on file  Tobacco Use  . Smoking status: Never Smoker  . Smokeless tobacco: Never Used  Vaping Use  . Vaping Use: Never used  Substance and Sexual Activity  . Alcohol use: No  . Drug use: No  . Sexual activity: Not on file  Other Topics Concern  . Not on file  Social History Narrative  . Not on file   Social Determinants of Health   Financial Resource Strain:   . Difficulty of Paying Living Expenses:   Food Insecurity:   . Worried About Programme researcher, broadcasting/film/video in the Last Year:   . Barista in the Last Year:   Transportation Needs:   . Freight forwarder (Medical):   Marland Kitchen Lack of Transportation (Non-Medical):   Physical Activity:   . Days of Exercise per Week:   . Minutes of Exercise per Session:   Stress:   . Feeling of Stress :   Social Connections:   . Frequency of Communication with Friends and Family:   . Frequency  of Social Gatherings with Friends and Family:   . Attends Religious Services:   . Active Member of Clubs or Organizations:   . Attends Banker Meetings:   Marland Kitchen Marital Status:      Family History: The patient's family history includes Osteoarthritis in her mother; Rheum arthritis in her mother; Stroke in her mother. ROS:   Please see the history of present illness.    All other systems reviewed and are negative.  EKGs/Labs/Other Studies Reviewed:    The following studies were reviewed today:  EKG:  EKG ordered today and personally reviewed.  The ekg ordered today demonstrates sinus rhythm 56 bpm QS in V2 lead placement versus old ASMI otherwise normal  Recent Labs: 10/09/2019: ALT 17; BUN 13; Creatinine, Ser 0.76; Magnesium 2.1; Potassium 4.6; Sodium 140  Recent Lipid  Panel    Component Value Date/Time   CHOL 222 (H) 10/09/2019 0806   TRIG 83 10/09/2019 0806   HDL 76 10/09/2019 0806   CHOLHDL 2.9 10/09/2019 0806   LDLCALC 132 (H) 10/09/2019 0806    Physical Exam:    VS:  BP 112/68   Pulse (!) 59   Ht 5' 1.5" (1.562 m)   Wt 105 lb (47.6 kg)   SpO2 95%   BMI 19.52 kg/m     Wt Readings from Last 3 Encounters:  10/17/19 105 lb (47.6 kg)  03/12/19 107 lb 3.2 oz (48.6 kg)  08/22/18 107 lb (48.5 kg)     GEN:  Well nourished, well developed in no acute distress HEENT: Normal NECK: No JVD; No carotid bruits LYMPHATICS: No lymphadenopathy CARDIAC: RRR, no murmurs, rubs, gallops RESPIRATORY:  Clear to auscultation without rales, wheezing or rhonchi  ABDOMEN: Soft, non-tender, non-distended MUSCULOSKELETAL:  No edema; No deformity  SKIN: Warm and dry NEUROLOGIC:  Alert and oriented x 3 PSYCHIATRIC:  Normal affect    Signed, Shirlee More, MD  10/17/2019 8:33 AM    [place]

## 2019-10-17 ENCOUNTER — Other Ambulatory Visit: Payer: Self-pay

## 2019-10-17 ENCOUNTER — Encounter: Payer: Self-pay | Admitting: Cardiology

## 2019-10-17 ENCOUNTER — Ambulatory Visit: Payer: Medicare PPO | Admitting: Cardiology

## 2019-10-17 VITALS — BP 112/68 | HR 59 | Ht 61.5 in | Wt 105.0 lb

## 2019-10-17 DIAGNOSIS — Z7901 Long term (current) use of anticoagulants: Secondary | ICD-10-CM | POA: Diagnosis not present

## 2019-10-17 DIAGNOSIS — I119 Hypertensive heart disease without heart failure: Secondary | ICD-10-CM

## 2019-10-17 DIAGNOSIS — E785 Hyperlipidemia, unspecified: Secondary | ICD-10-CM

## 2019-10-17 DIAGNOSIS — I480 Paroxysmal atrial fibrillation: Secondary | ICD-10-CM | POA: Diagnosis not present

## 2019-10-17 DIAGNOSIS — Z79899 Other long term (current) drug therapy: Secondary | ICD-10-CM

## 2019-10-17 NOTE — Addendum Note (Signed)
Addended by: Hayden Kihara, Elmarie Shiley L on: 10/17/2019 04:12 PM   Modules accepted: Orders

## 2019-10-17 NOTE — Patient Instructions (Signed)

## 2019-10-22 ENCOUNTER — Other Ambulatory Visit: Payer: Self-pay | Admitting: Family Medicine

## 2019-10-22 DIAGNOSIS — Z1231 Encounter for screening mammogram for malignant neoplasm of breast: Secondary | ICD-10-CM

## 2019-11-01 ENCOUNTER — Telehealth: Payer: Self-pay | Admitting: Cardiology

## 2019-11-01 NOTE — Telephone Encounter (Signed)
Always a problem Leah Simmons let us just do what we do any other time which is force her into my schedule Sunday next week we really need to come up with a plan for how sick patients can get in and be seen in the office especially as were overbooked beginning the days

## 2019-11-01 NOTE — Telephone Encounter (Signed)
Called patient added her onto Dr. Hulen Shouts schedule for next week. No further questions at this time.

## 2019-11-01 NOTE — Telephone Encounter (Signed)
I do not know of any other solution

## 2019-11-01 NOTE — Telephone Encounter (Signed)
PT called and said she went to the ER in [place] last night because her heart was in Afib. She was told to follow up with Dr. Dulce Sellar in a week but there are no appointments on his calendar until August 2. She was hoping she could be worked into his schedule sooner

## 2019-11-06 NOTE — Progress Notes (Signed)
Cardiology Office Note:    Date:  11/07/2019   ID:  Leah Simmons, DOB [birth year], MRN [id number]  PCP:  Olive Bass, MD  Cardiologist:  Norman Herrlich, MD    Referring MD: Olive Bass, MD    ASSESSMENT:    1. Paroxysmal atrial fibrillation (HCC)   2. Long term current use of anticoagulant therapy   3. High risk medication use   4. Hypertensive heart disease without heart failure    PLAN:    In order of problems listed above:  1. Unfortunately she has had recurrent atrial fibrillation elects to start antiarrhythmic drug flecainide is safe to do as an outpatient and her EKG from the ED had a normal QRS duration QT interval she will return in 1 week for an EKG to exclude 1C toxicity see me in 4 to 6 weeks continue her warfarin anticoagulation that is effective digoxin without toxicity and blood pressure is well controlled on current treatment   Next appointment: 4 to 6 weeks   Medication Adjustments/Labs and Tests Ordered: Current medicines are reviewed at length with the patient today.  Concerns regarding medicines are outlined above.  No orders of the defined types were placed in this encounter.  No orders of the defined types were placed in this encounter.   Chief Complaint  Patient presents with  . Atrial Fibrillation    History of Present Illness:    Leah Simmons is a 74 y.o. female with a hx of atrial fibrillation on digoxin, hyperlipidemia, hypertension and anticoagulation  last seen 10/17/2019.  She is seen today in follow-up to recent [facility] health ED visit with paroxysmal atrial fibrillation. Compliance with diet, lifestyle and medications: Yes  She is understandably apprehensive about atrial fibrillation recurrence.  I reviewed the natural history the options including intermittent oral calcium channel blocker for rate control initiating antiarrhythmic drug flecainide a referral for PVI EP ablation.  After discussion and consideration will  initiate flecainide 50 mg twice daily I will ask her to return in 1 week we will do an office EKG I will see her back in the office in 4 to 6 weeks.  Fortunately there is no interaction with her warfarin.  The episode lasted for perhaps 9 hours and resolved soon after arrival to the emergency department. Past Medical History:  Diagnosis Date  . Allergic rhinitis 09/06/2015  . Anxiety 09/06/2015  . Atrophic vaginitis 02/08/2016  . Benign hypertension 09/06/2015  . Chronic atrial fibrillation (HCC) 07/27/2015   Overview:  2003: dx  . Chronic rheumatic arthritis (HCC) 02/08/2016  . Congestive heart failure (CHF) (HCC) 04/19/2016  . GERD (gastroesophageal reflux disease) 02/08/2016  . Long term current use of anticoagulant therapy 07/27/2015  . Mixed hyperlipidemia 11/07/2014  . Osteoporosis 02/08/2016   Overview:  2002: -2.0 2012: -1.4 2014: -1.9 2017: -1.9 2017: fx humerus  . Screening for breast cancer 10/19/2016  . Screening for cervical cancer 10/19/2016  . Screening for colon cancer 10/19/2016  . Screening for diabetes mellitus (DM) 10/19/2016  . Sick sinus syndrome (HCC) 09/06/2015  . TIA (transient ischemic attack) 09/06/2015   Overview:  2003: right face sensation changes  . Wellness examination 10/19/2016    Past Surgical History:  Procedure Laterality Date  . LAPAROSCOPIC CHOLECYSTECTOMY    . TONSILLECTOMY      Current Medications: Current Meds  Medication Sig  . B Complex Vitamins (VITAMIN B COMPLEX PO) Take 1 tablet by mouth daily.  . Calcium Carb-Cholecalciferol (CALCIUM-VITAMIN D) 500-200  MG-UNIT tablet Take 1 tablet by mouth daily.  . candesartan (ATACAND) 32 MG tablet TAKE 1/2 TABLET(16 MG) BY MOUTH DAILY  . Cholecalciferol (VITAMIN D3) 2000 units capsule Take 2,000 Units by mouth daily.  Marland Kitchen conjugated estrogens (PREMARIN) vaginal cream Place vaginally. Insert 0.5 g into the vagina two times per week  . cycloSPORINE (RESTASIS) 0.05 % ophthalmic emulsion Place 1 drop into both eyes  daily.  Marland Kitchen DIGOX 125 MCG tablet Take 1 tablet (125 mcg total) by mouth every other day.  . loratadine (CLARITIN) 10 MG tablet Take 10 mg by mouth daily.  . Magnesium Oxide 140 MG CAPS Take 140 mg by mouth daily.  . mometasone (NASONEX) 50 MCG/ACT nasal spray Place 2 sprays into the nose daily.  Marland Kitchen omeprazole (PRILOSEC) 20 MG capsule Take 20 mg by mouth daily as needed for indigestion.  Marland Kitchen PARoxetine (PAXIL) 10 MG tablet Take 10 mg by mouth daily.  . potassium chloride SA (KLOR-CON) 20 MEQ tablet TAKE 1 TABLET(20 MEQ) BY MOUTH TWICE DAILY  . traZODone (DESYREL) 50 MG tablet Take 50 mg by mouth at bedtime.  Marland Kitchen warfarin (COUMADIN) 2 MG tablet Take 2 mg by mouth daily. As Directed. Currently taking 3mg /3mg /3mg /1.5mg   . warfarin (COUMADIN) 3 MG tablet Take 3 mg by mouth daily. As Directed. Currently taking 3mg /3mg /3mg /1.5mg   . warfarin (JANTOVEN) 3 MG tablet 1 tablet for 3 days in a row then 1/2 tablet     Allergies:   Clindamycin, Epinephrine, Levofloxacin, Moxifloxacin, Penicillins, Pravastatin, Sulfa antibiotics, Clarithromycin, and Other   Social History   Socioeconomic History  . Marital status: Widowed    Spouse name: Not on file  . Number of children: Not on file  . Years of education: Not on file  . Highest education level: Not on file  Occupational History  . Not on file  Tobacco Use  . Smoking status: Never Smoker  . Smokeless tobacco: Never Used  Vaping Use  . Vaping Use: Never used  Substance and Sexual Activity  . Alcohol use: No  . Drug use: No  . Sexual activity: Not on file  Other Topics Concern  . Not on file  Social History Narrative  . Not on file   Social Determinants of Health   Financial Resource Strain:   . Difficulty of Paying Living Expenses:   Food Insecurity:   . Worried About in the Last Year:   . in the Last Year:   Transportation Needs:   . Programme researcher, broadcasting/film/video (Medical):   Barista Lack of Transportation  (Non-Medical):   Physical Activity:   . Days of Exercise per Week:   . Minutes of Exercise per Session:   Stress:   . Feeling of Stress :   Social Connections:   . Frequency of Communication with Friends and Family:   . Frequency of Social Gatherings with Friends and Family:   . Attends Religious Services:   . Active Member of Clubs or Organizations:   . Attends Freight forwarder Meetings:   Marland Kitchen Marital Status:      Family History: The patient's family history includes Osteoarthritis in her mother; Rheum arthritis in her mother; Stroke in her mother. ROS:   Please see the history of present illness.    All other systems reviewed and are negative.  EKGs/Labs/Other Studies Reviewed:    The following studies were reviewed today:    Recent Labs: 10/09/2019: ALT 17; BUN 13; Creatinine, Ser 0.76; Magnesium  2.1; Potassium 4.6; Sodium 140  Recent Lipid Panel    Component Value Date/Time   CHOL 222 (H) 10/09/2019 0806   TRIG 83 10/09/2019 0806   HDL 76 10/09/2019 0806   CHOLHDL 2.9 10/09/2019 0806   LDLCALC 132 (H) 10/09/2019 0806    Physical Exam:    VS:  BP 120/74 (BP Location: Right Arm, Patient Position: Sitting, Cuff Size: Normal)   Pulse 62   Ht 5' 1.5" (1.562 m)   Wt 106 lb 3.2 oz (48.2 kg)   SpO2 97%   BMI 19.74 kg/m     Wt Readings from Last 3 Encounters:  11/07/19 106 lb 3.2 oz (48.2 kg)  10/17/19 105 lb (47.6 kg)  03/12/19 107 lb 3.2 oz (48.6 kg)     GEN:  Well nourished, well developed in no acute distress HEENT: Normal NECK: No JVD; No carotid bruits LYMPHATICS: No lymphadenopathy CARDIAC: RRR, no murmurs, rubs, gallops RESPIRATORY:  Clear to auscultation without rales, wheezing or rhonchi  ABDOMEN: Soft, non-tender, non-distended MUSCULOSKELETAL:  No edema; No deformity  SKIN: Warm and dry NEUROLOGIC:  Alert and oriented x 3 PSYCHIATRIC:  Normal affect    Signed, Norman Herrlich, MD  11/07/2019 11:13 AM    [facility]

## 2019-11-07 ENCOUNTER — Encounter: Payer: Self-pay | Admitting: Cardiology

## 2019-11-07 ENCOUNTER — Ambulatory Visit: Payer: Medicare PPO | Admitting: Cardiology

## 2019-11-07 ENCOUNTER — Other Ambulatory Visit: Payer: Self-pay

## 2019-11-07 VITALS — BP 120/74 | HR 62 | Ht 61.5 in | Wt 106.2 lb

## 2019-11-07 DIAGNOSIS — Z79899 Other long term (current) drug therapy: Secondary | ICD-10-CM | POA: Diagnosis not present

## 2019-11-07 DIAGNOSIS — I480 Paroxysmal atrial fibrillation: Secondary | ICD-10-CM | POA: Diagnosis not present

## 2019-11-07 DIAGNOSIS — I119 Hypertensive heart disease without heart failure: Secondary | ICD-10-CM | POA: Diagnosis not present

## 2019-11-07 DIAGNOSIS — Z7901 Long term (current) use of anticoagulants: Secondary | ICD-10-CM

## 2019-11-07 MED ORDER — FLECAINIDE ACETATE 50 MG PO TABS
50.0000 mg | ORAL_TABLET | Freq: Two times a day (BID) | ORAL | 3 refills | Status: DC
Start: 2019-11-07 — End: 2020-10-30

## 2019-11-07 MED ORDER — DILTIAZEM HCL 30 MG PO TABS: 30.0000 mg | ORAL_TABLET | Freq: Four times a day (QID) | ORAL | 3 refills | Status: DC | PRN

## 2019-11-07 NOTE — Patient Instructions (Signed)
Medication Instructions:  Your physician has recommended you make the following change in your medication:  START: Flecainide 50 mg take one tablet by mouth twice daily.  START: Cardizem 30 mg take one tablet by mouth every 6 hours as needed for your heart rate. Only take this if your heart rate is above 100 beats per minute.  *If you need a refill on your cardiac medications before your next appointment, please call your pharmacy*   Lab Work: None If you have labs (blood work) drawn today and your tests are completely normal, you will receive your results only by: Marland Kitchen MyChart Message (if you have MyChart) OR . A paper copy in the mail If you have any lab test that is abnormal or we need to change your treatment, we will call you to review the results.   Testing/Procedures: None   Follow-Up: At [facility], you and your health needs are our priority.  As part of our continuing mission to provide you with exceptional heart care, we have created designated Provider Care Teams.  These Care Teams include your primary Cardiologist (physician) and Advanced Practice Providers (APPs -  Physician Assistants and Nurse Practitioners) who all work together to provide you with the care you need, when you need it.  We recommend signing up for the patient portal called "MyChart".  Sign up information is provided on this After Visit Summary.  MyChart is used to connect with patients for Virtual Visits (Telemedicine).  Patients are able to view lab/test results, encounter notes, upcoming appointments, etc.  Non-urgent messages can be sent to your provider as well.   To learn more about what you can do with MyChart, go to [email or web address].    Your next appointment:   1 week(s) for EKG and labs. 4-6 week follow up with Dr. Dulce Sellar.  The format for your next appointment:   In Person  Provider:   Norman Herrlich, MD   Other Instructions

## 2019-11-14 ENCOUNTER — Encounter: Payer: Self-pay | Admitting: Cardiology

## 2019-11-14 ENCOUNTER — Ambulatory Visit: Payer: Medicare PPO | Admitting: Cardiology

## 2019-11-14 ENCOUNTER — Other Ambulatory Visit: Payer: Self-pay

## 2019-11-14 VITALS — BP 118/64 | HR 56 | Ht 61.5 in | Wt 107.0 lb

## 2019-11-14 DIAGNOSIS — E782 Mixed hyperlipidemia: Secondary | ICD-10-CM | POA: Diagnosis not present

## 2019-11-14 DIAGNOSIS — I110 Hypertensive heart disease with heart failure: Secondary | ICD-10-CM

## 2019-11-14 DIAGNOSIS — Z79899 Other long term (current) drug therapy: Secondary | ICD-10-CM | POA: Diagnosis not present

## 2019-11-14 DIAGNOSIS — I480 Paroxysmal atrial fibrillation: Secondary | ICD-10-CM

## 2019-11-14 NOTE — Patient Instructions (Signed)
Medication Instructions:  Your physician recommends that you continue on your current medications as directed. Please refer to the Current Medication list given to you today.  *If you need a refill on your cardiac medications before your next appointment, please call your pharmacy*   Lab Work: Your physician recommends that you return for lab work in: TODAY Flecainide level If you have labs (blood work) drawn today and your tests are completely normal, you will receive your results only by:  MyChart Message (if you have MyChart) OR  A paper copy in the mail If you have any lab test that is abnormal or we need to change your treatment, we will call you to review the results.   Testing/Procedures: None   Follow-Up: At [facility], you and your health needs are our priority.  As part of our continuing mission to provide you with exceptional heart care, we have created designated Provider Care Teams.  These Care Teams include your primary Cardiologist (physician) and Advanced Practice Providers (APPs -  Physician Assistants and Nurse Practitioners) who all work together to provide you with the care you need, when you need it.  We recommend signing up for the patient portal called "MyChart".  Sign up information is provided on this After Visit Summary.  MyChart is used to connect with patients for Virtual Visits (Telemedicine).  Patients are able to view lab/test results, encounter notes, upcoming appointments, etc.  Non-urgent messages can be sent to your provider as well.   To learn more about what you can do with MyChart, go to [email or web address].    Your next appointment:   12/05/19 at 8:40 AM  The format for your next appointment:   In Person  Provider:   Norman Herrlich, MD   Other Instructions

## 2019-11-14 NOTE — Progress Notes (Signed)
Cardiology Office Note:    Date:  11/14/2019   ID:  Leah Simmons, DOB [birth year], MRN [id number]  PCP:  Olive Bass, MD  Cardiologist:  No primary care provider on file.  Electrophysiologist:  None   Referring MD: Olive Bass, MD   Chief Complaint  Patient presents with  . Follow-up    Started on Flecainide last week, doing fine per patient  " I am doing well"  History of Present Illness:    Leah Simmons is a 74 y.o. female with a hx of paroxysmal atrial fibrillation recently started on flecainide, hypertensive heart disease, hyperlipidemia presents today for follow-up visit.  The patient was started on flecainide about a week ago and is here today for EKG.  She denies any palpitations, or chest pain.  Past Medical History:  Diagnosis Date  . Allergic rhinitis 09/06/2015  . Anxiety 09/06/2015  . Atrophic vaginitis 02/08/2016  . Benign hypertension 09/06/2015  . Chronic atrial fibrillation (HCC) 07/27/2015   Overview:  2003: dx  . Chronic rheumatic arthritis (HCC) 02/08/2016  . Congestive heart failure (CHF) (HCC) 04/19/2016  . GERD (gastroesophageal reflux disease) 02/08/2016  . Long term current use of anticoagulant therapy 07/27/2015  . Mixed hyperlipidemia 11/07/2014  . Osteoporosis 02/08/2016   Overview:  2002: -2.0 2012: -1.4 2014: -1.9 2017: -1.9 2017: fx humerus  . Screening for breast cancer 10/19/2016  . Screening for cervical cancer 10/19/2016  . Screening for colon cancer 10/19/2016  . Screening for diabetes mellitus (DM) 10/19/2016  . Sick sinus syndrome (HCC) 09/06/2015  . TIA (transient ischemic attack) 09/06/2015   Overview:  2003: right face sensation changes  . Wellness examination 10/19/2016    Past Surgical History:  Procedure Laterality Date  . LAPAROSCOPIC CHOLECYSTECTOMY    . TONSILLECTOMY      Current Medications: Current Meds  Medication Sig  . B Complex Vitamins (VITAMIN B COMPLEX PO) Take 1 tablet by mouth daily.  . Calcium  Carb-Cholecalciferol (CALCIUM-VITAMIN D) 500-200 MG-UNIT tablet Take 1 tablet by mouth daily.  . candesartan (ATACAND) 32 MG tablet TAKE 1/2 TABLET(16 MG) BY MOUTH DAILY  . Cholecalciferol (VITAMIN D3) 2000 units capsule Take 2,000 Units by mouth daily.  Marland Kitchen conjugated estrogens (PREMARIN) vaginal cream Place vaginally. Insert 0.5 g into the vagina two times per week  . cycloSPORINE (RESTASIS) 0.05 % ophthalmic emulsion Place 1 drop into both eyes daily.  Marland Kitchen DIGOX 125 MCG tablet Take 1 tablet (125 mcg total) by mouth every other day.  . diltiazem (CARDIZEM) 30 MG tablet Take 1 tablet (30 mg total) by mouth 4 (four) times daily as needed.  . flecainide (TAMBOCOR) 50 MG tablet Take 1 tablet (50 mg total) by mouth 2 (two) times daily.  Marland Kitchen loratadine (CLARITIN) 10 MG tablet Take 10 mg by mouth daily.  . Magnesium Oxide 140 MG CAPS Take 140 mg by mouth daily.  . mometasone (NASONEX) 50 MCG/ACT nasal spray Place 2 sprays into the nose daily.  Marland Kitchen omeprazole (PRILOSEC) 20 MG capsule Take 20 mg by mouth daily as needed for indigestion.  Marland Kitchen PARoxetine (PAXIL) 10 MG tablet Take 10 mg by mouth daily.  . potassium chloride SA (KLOR-CON) 20 MEQ tablet TAKE 1 TABLET(20 MEQ) BY MOUTH TWICE DAILY  . traZODone (DESYREL) 50 MG tablet Take 50 mg by mouth at bedtime.  Marland Kitchen warfarin (JANTOVEN) 3 MG tablet 1 tablet for 3 days in a row then 1/2 tablet     Allergies:   Clindamycin, Epinephrine,  Levofloxacin, Moxifloxacin, Penicillins, Pravastatin, Sulfa antibiotics, Clarithromycin, and Other   Social History   Socioeconomic History  . Marital status: Widowed    Spouse name: Not on file  . Number of children: Not on file  . Years of education: Not on file  . Highest education level: Not on file  Occupational History  . Not on file  Tobacco Use  . Smoking status: Never Smoker  . Smokeless tobacco: Never Used  Vaping Use  . Vaping Use: Never used  Substance and Sexual Activity  . Alcohol use: No  . Drug use: No  .  Sexual activity: Not on file  Other Topics Concern  . Not on file  Social History Narrative  . Not on file   Social Determinants of Health   Financial Resource Strain:   . Difficulty of Paying Living Expenses:   Food Insecurity:   . Worried About Programme researcher, broadcasting/film/video in the Last Year:   . Barista in the Last Year:   Transportation Needs:   . Freight forwarder (Medical):   Marland Kitchen Lack of Transportation (Non-Medical):   Physical Activity:   . Days of Exercise per Week:   . Minutes of Exercise per Session:   Stress:   . Feeling of Stress :   Social Connections:   . Frequency of Communication with Friends and Family:   . Frequency of Social Gatherings with Friends and Family:   . Attends Religious Services:   . Active Member of Clubs or Organizations:   . Attends Banker Meetings:   Marland Kitchen Marital Status:      Family History: The patient's family history includes Osteoarthritis in her mother; Rheum arthritis in her mother; Stroke in her mother.  ROS:   Review of Systems  Constitution: Negative for decreased appetite, fever and weight gain.  HENT: Negative for congestion, ear discharge, hoarse voice and sore throat.   Eyes: Negative for discharge, redness, vision loss in right eye and visual halos.  Cardiovascular: Negative for chest pain, dyspnea on exertion, leg swelling, orthopnea and palpitations.  Respiratory: Negative for cough, hemoptysis, shortness of breath and snoring.   Endocrine: Negative for heat intolerance and polyphagia.  Hematologic/Lymphatic: Negative for bleeding problem. Does not bruise/bleed easily.  Skin: Negative for flushing, nail changes, rash and suspicious lesions.  Musculoskeletal: Negative for arthritis, joint pain, muscle cramps, myalgias, neck pain and stiffness.  Gastrointestinal: Negative for abdominal pain, bowel incontinence, diarrhea and excessive appetite.  Genitourinary: Negative for decreased libido, genital sores and  incomplete emptying.  Neurological: Negative for brief paralysis, focal weakness, headaches and loss of balance.  Psychiatric/Behavioral: Negative for altered mental status, depression and suicidal ideas.  Allergic/Immunologic: Negative for HIV exposure and persistent infections.    EKGs/Labs/Other Studies Reviewed:    The following studies were reviewed today:   EKG:  The ekg ordered today demonstrates sinus bradycardia, heart rate 56 bpm, underlying incomplete right bundle branch block, QRS duration 96 ms QT interval 378 milliseconds.  Recent Labs: 10/09/2019: ALT 17; BUN 13; Creatinine, Ser 0.76; Magnesium 2.1; Potassium 4.6; Sodium 140  Recent Lipid Panel    Component Value Date/Time   CHOL 222 (H) 10/09/2019 0806   TRIG 83 10/09/2019 0806   HDL 76 10/09/2019 0806   CHOLHDL 2.9 10/09/2019 0806   LDLCALC 132 (H) 10/09/2019 0806    Physical Exam:    VS:  BP 118/64 (BP Location: Right Arm, Patient Position: Sitting, Cuff Size: Normal)   Pulse (!) 56  Ht 5' 1.5" (1.562 m)   Wt 107 lb (48.5 kg)   SpO2 97%   BMI 19.89 kg/m     Wt Readings from Last 3 Encounters:  11/14/19 107 lb (48.5 kg)  11/07/19 106 lb 3.2 oz (48.2 kg)  10/17/19 105 lb (47.6 kg)     GEN: Well nourished, well developed in no acute distress HEENT: Normal NECK: No JVD; No carotid bruits LYMPHATICS: No lymphadenopathy CARDIAC: S1S2 noted,RRR, no murmurs, rubs, gallops RESPIRATORY:  Clear to auscultation without rales, wheezing or rhonchi  ABDOMEN: Soft, non-tender, non-distended, +bowel sounds, no guarding. EXTREMITIES: No edema, No cyanosis, no clubbing MUSCULOSKELETAL:  No deformity  SKIN: Warm and dry NEUROLOGIC:  Alert and oriented x 3, non-focal PSYCHIATRIC:  Normal affect, good insight  ASSESSMENT:    1. Medication management   2. Hypertensive heart disease with congestive heart failure, unspecified heart failure type (HCC)   3. Paroxysmal atrial fibrillation (HCC)   4. Mixed  hyperlipidemia    PLAN:     1.  Patient continues to be in sinus rhythm.  Continue her flecainide at current dose with Cardizem.  We will get flecainide level.  She will follow up with Dr. Dulce Sellar as scheduled.  2.  Her blood pressure acceptable in the office today no changes will be made to her medication regimen.  The patient is in agreement with the above plan. The patient left the office in stable condition.  The patient will follow up scheduled Dr. Dulce Sellar   Medication Adjustments/Labs and Tests Ordered: Current medicines are reviewed at length with the patient today.  Concerns regarding medicines are outlined above.  Orders Placed This Encounter  Procedures  . Flecainide level   No orders of the defined types were placed in this encounter.   Patient Instructions  Medication Instructions:  Your physician recommends that you continue on your current medications as directed. Please refer to the Current Medication list given to you today.  *If you need a refill on your cardiac medications before your next appointment, please call your pharmacy*   Lab Work: Your physician recommends that you return for lab work in: TODAY Flecainide level If you have labs (blood work) drawn today and your tests are completely normal, you will receive your results only by: Marland Kitchen MyChart Message (if you have MyChart) OR . A paper copy in the mail If you have any lab test that is abnormal or we need to change your treatment, we will call you to review the results.   Testing/Procedures: None   Follow-Up: At [facility], you and your health needs are our priority.  As part of our continuing mission to provide you with exceptional heart care, we have created designated Provider Care Teams.  These Care Teams include your primary Cardiologist (physician) and Advanced Practice Providers (APPs -  Physician Assistants and Nurse Practitioners) who all work together to provide you with the care you need,  when you need it.  We recommend signing up for the patient portal called "MyChart".  Sign up information is provided on this After Visit Summary.  MyChart is used to connect with patients for Virtual Visits (Telemedicine).  Patients are able to view lab/test results, encounter notes, upcoming appointments, etc.  Non-urgent messages can be sent to your provider as well.   To learn more about what you can do with MyChart, go to [email or web address].    Your next appointment:   12/05/19 at 8:40 AM  The format for your next appointment:  In Person  Provider:   Norman Herrlich, MD   Other Instructions      Adopting a Healthy Lifestyle.  Know what a healthy weight is for you (roughly BMI <25) and aim to maintain this   Aim for 7+ servings of fruits and vegetables daily   65-80+ fluid ounces of water or unsweet tea for healthy kidneys   Limit to max 1 drink of alcohol per day; avoid smoking/tobacco   Limit animal fats in diet for cholesterol and heart health - choose grass fed whenever available   Avoid highly processed foods, and foods high in saturated/trans fats   Aim for low stress - take time to unwind and care for your mental health   Aim for 150 min of moderate intensity exercise weekly for heart health, and weights twice weekly for bone health   Aim for 7-9 hours of sleep daily   When it comes to diets, agreement about the perfect plan isnt easy to find, even among the experts. Experts at the [facility] [employer] developed an idea known as the Healthy Eating Plate. Just imagine a plate divided into logical, healthy portions.   The emphasis is on diet quality:   Load up on vegetables and fruits - one-half of your plate: Aim for color and variety, and remember that potatoes dont count.   Go for whole grains - one-quarter of your plate: Whole wheat, barley, wheat berries, quinoa, oats, brown rice, and foods made with them. If you want pasta, go with whole  wheat pasta.   Protein power - one-quarter of your plate: Fish, chicken, beans, and nuts are all healthy, versatile protein sources. Limit red meat.   The diet, however, does go beyond the plate, offering a few other suggestions.   Use healthy plant oils, such as olive, canola, soy, corn, sunflower and peanut. Check the labels, and avoid partially hydrogenated oil, which have unhealthy trans fats.   If youre thirsty, drink water. Coffee and tea are good in moderation, but skip sugary drinks and limit milk and dairy products to one or two daily servings.   The type of carbohydrate in the diet is more important than the amount. Some sources of carbohydrates, such as vegetables, fruits, whole grains, and beans-are healthier than others.   Finally, stay active  Signed, Thomasene Ripple, DO  11/14/2019 10:04 AM    [facility]

## 2019-11-15 ENCOUNTER — Other Ambulatory Visit: Payer: Self-pay | Admitting: Cardiology

## 2019-11-16 LAB — FLECAINIDE LEVEL: Flecainide: 0.31 ug/mL (ref 0.20–1.00)

## 2019-11-18 ENCOUNTER — Telehealth: Payer: Self-pay

## 2019-11-18 NOTE — Telephone Encounter (Signed)
-----   Message from Thomasene Ripple, DO sent at 11/18/2019 10:07 AM EDT ----- Flecainide level within therapeutic range.

## 2019-11-18 NOTE — Telephone Encounter (Signed)
Spoke with patient regarding results and recommendation.  Patient verbalizes understanding and is agreeable to plan of care. Advised patient to call back with any issues or concerns.  

## 2019-11-27 ENCOUNTER — Ambulatory Visit
Admission: RE | Admit: 2019-11-27 | Discharge: 2019-11-27 | Disposition: A | Discharge status: Home / Self Care | Payer: Medicare PPO | Source: Ambulatory Visit | Attending: Family Medicine | Admitting: Family Medicine

## 2019-11-27 DIAGNOSIS — Z1231 Encounter for screening mammogram for malignant neoplasm of breast: Secondary | ICD-10-CM

## 2019-12-04 NOTE — Progress Notes (Signed)
Cardiology Office Note:    Date:  12/05/2019   ID:  Leah Simmons, DOB [birth year], MRN [id number]  PCP:  Olive Bass, MD  Cardiologist:  Norman Herrlich, MD    Referring MD: Olive Bass, MD    ASSESSMENT:    1. Paroxysmal atrial fibrillation (HCC)   2. High risk medication use   3. Long term current use of anticoagulant therapy   4. Hypertensive heart disease with congestive heart failure, unspecified heart failure type (HCC)    PLAN:    In order of problems listed above:  1. She tolerates flecainide maintaining sinus rhythm and she is on digoxin for rate control she will continue her anticoagulant screen her self at home and will check a flecainide level in the office today. 2. Hypertension stable BP at target continue current antihypertensive ARB   Next appointment: 6 months   Medication Adjustments/Labs and Tests Ordered: Current medicines are reviewed at length with the patient today.  Concerns regarding medicines are outlined above.  No orders of the defined types were placed in this encounter.  No orders of the defined types were placed in this encounter.   Chief Complaint  Patient presents with  . Follow-up    She was placed on flecainide as an outpatient  . Atrial Fibrillation    History of Present Illness:    Leah Simmons is a 74 y.o. female with a hx of paroxysmal atrial fibrillation recently initiated on flecainide as an outpatient last seen 11/14/2019 and her EKG showed no findings of 1C antiarrhythmic toxicity. Compliance with diet, lifestyle and medications: Yes  She is seen back in the office by me tolerates flecainide no side effects and no recurrent atrial fibrillation.  She is screening her blood pressure and heart rate at home noninvasively.  No palpitations syncope chest pain and no bleeding from her anticoagulant.  With her small body stature we will check a flecainide level today Past Medical History:  Diagnosis Date  . Allergic  rhinitis 09/06/2015  . Anxiety 09/06/2015  . Atrophic vaginitis 02/08/2016  . Benign hypertension 09/06/2015  . Chronic atrial fibrillation (HCC) 07/27/2015   Overview:  2003: dx  . Chronic rheumatic arthritis (HCC) 02/08/2016  . Congestive heart failure (CHF) (HCC) 04/19/2016  . GERD (gastroesophageal reflux disease) 02/08/2016  . Long term current use of anticoagulant therapy 07/27/2015  . Mixed hyperlipidemia 11/07/2014  . Osteoporosis 02/08/2016   Overview:  2002: -2.0 2012: -1.4 2014: -1.9 2017: -1.9 2017: fx humerus  . Screening for breast cancer 10/19/2016  . Screening for cervical cancer 10/19/2016  . Screening for colon cancer 10/19/2016  . Screening for diabetes mellitus (DM) 10/19/2016  . Sick sinus syndrome (HCC) 09/06/2015  . TIA (transient ischemic attack) 09/06/2015   Overview:  2003: right face sensation changes  . Wellness examination 10/19/2016    Past Surgical History:  Procedure Laterality Date  . LAPAROSCOPIC CHOLECYSTECTOMY    . TONSILLECTOMY      Current Medications: Current Meds  Medication Sig  . B Complex Vitamins (VITAMIN B COMPLEX PO) Take 1 tablet by mouth daily.  . Calcium Carb-Cholecalciferol (CALCIUM-VITAMIN D) 500-200 MG-UNIT tablet Take 1 tablet by mouth daily.  . candesartan (ATACAND) 32 MG tablet TAKE 1/2 TABLET(16 MG) BY MOUTH DAILY  . Cholecalciferol (VITAMIN D3) 2000 units capsule Take 2,000 Units by mouth daily.  Marland Kitchen conjugated estrogens (PREMARIN) vaginal cream Place vaginally. Insert 0.5 g into the vagina two times per week  . cycloSPORINE (RESTASIS) 0.05 %  ophthalmic emulsion Place 1 drop into both eyes daily.  Marland Kitchen DIGOX 125 MCG tablet Take 1 tablet (125 mcg total) by mouth every other day.  . diltiazem (CARDIZEM) 30 MG tablet Take 1 tablet (30 mg total) by mouth 4 (four) times daily as needed.  . flecainide (TAMBOCOR) 50 MG tablet Take 1 tablet (50 mg total) by mouth 2 (two) times daily.  Marland Kitchen loratadine (CLARITIN) 10 MG tablet Take 10 mg by mouth daily.    . Magnesium Oxide 140 MG CAPS Take 140 mg by mouth daily.  . mometasone (NASONEX) 50 MCG/ACT nasal spray Place 2 sprays into the nose daily.  Marland Kitchen omeprazole (PRILOSEC) 20 MG capsule Take 20 mg by mouth daily as needed for indigestion.  Marland Kitchen PARoxetine (PAXIL) 10 MG tablet Take 10 mg by mouth daily.  . potassium chloride SA (KLOR-CON) 20 MEQ tablet TAKE 1 TABLET(20 MEQ) BY MOUTH TWICE DAILY  . traZODone (DESYREL) 50 MG tablet Take 50 mg by mouth at bedtime.  Marland Kitchen warfarin (JANTOVEN) 3 MG tablet 1 tablet for 3 days in a row then 1/2 tablet     Allergies:   Clindamycin, Epinephrine, Levofloxacin, Moxifloxacin, Penicillins, Pravastatin, Sulfa antibiotics, Clarithromycin, and Other   Social History   Socioeconomic History  . Marital status: Widowed    Spouse name: Not on file  . Number of children: Not on file  . Years of education: Not on file  . Highest education level: Not on file  Occupational History  . Not on file  Tobacco Use  . Smoking status: Never Smoker  . Smokeless tobacco: Never Used  Vaping Use  . Vaping Use: Never used  Substance and Sexual Activity  . Alcohol use: No  . Drug use: No  . Sexual activity: Not on file  Other Topics Concern  . Not on file  Social History Narrative  . Not on file   Social Determinants of Health   Financial Resource Strain:   . Difficulty of Paying Living Expenses:   Food Insecurity:   . Worried About Programme researcher, broadcasting/film/video in the Last Year:   . Barista in the Last Year:   Transportation Needs:   . Freight forwarder (Medical):   Marland Kitchen Lack of Transportation (Non-Medical):   Physical Activity:   . Days of Exercise per Week:   . Minutes of Exercise per Session:   Stress:   . Feeling of Stress :   Social Connections:   . Frequency of Communication with Friends and Family:   . Frequency of Social Gatherings with Friends and Family:   . Attends Religious Services:   . Active Member of Clubs or Organizations:   . Attends Tax inspector Meetings:   Marland Kitchen Marital Status:      Family History: The patient's family history includes Osteoarthritis in her mother; Rheum arthritis in her mother; Stroke in her mother. ROS:   Please see the history of present illness.    All other systems reviewed and are negative.  EKGs/Labs/Other Studies Reviewed:    The following studies were reviewed today:     Recent Labs: 10/09/2019: ALT 17; BUN 13; Creatinine, Ser 0.76; Magnesium 2.1; Potassium 4.6; Sodium 140  Recent Lipid Panel    Component Value Date/Time   CHOL 222 (H) 10/09/2019 0806   TRIG 83 10/09/2019 0806   HDL 76 10/09/2019 0806   CHOLHDL 2.9 10/09/2019 0806   LDLCALC 132 (H) 10/09/2019 0806    Physical Exam:  VS:  BP 122/72 (BP Location: Left Arm, Patient Position: Sitting, Cuff Size: Normal)   Pulse 56   Ht 5' 1.5" (1.562 m)   Wt 105 lb 6.4 oz (47.8 kg)   SpO2 95%   BMI 19.59 kg/m     Wt Readings from Last 3 Encounters:  12/05/19 105 lb 6.4 oz (47.8 kg)  11/14/19 107 lb (48.5 kg)  11/07/19 106 lb 3.2 oz (48.2 kg)     GEN:  Well nourished, well developed in no acute distress HEENT: Normal NECK: No JVD; No carotid bruits LYMPHATICS: No lymphadenopathy CARDIAC: RRR, no murmurs, rubs, gallops RESPIRATORY:  Clear to auscultation without rales, wheezing or rhonchi  ABDOMEN: Soft, non-tender, non-distended MUSCULOSKELETAL:  No edema; No deformity  SKIN: Warm and dry NEUROLOGIC:  Alert and oriented x 3 PSYCHIATRIC:  Normal affect    Signed, Norman Herrlich, MD  12/05/2019 8:38 AM    [facility]

## 2019-12-05 ENCOUNTER — Encounter: Payer: Self-pay | Admitting: Cardiology

## 2019-12-05 ENCOUNTER — Ambulatory Visit (INDEPENDENT_AMBULATORY_CARE_PROVIDER_SITE_OTHER): Payer: Medicare PPO | Admitting: Cardiology

## 2019-12-05 ENCOUNTER — Other Ambulatory Visit: Payer: Self-pay

## 2019-12-05 VITALS — BP 122/72 | HR 56 | Ht 61.5 in | Wt 105.4 lb

## 2019-12-05 DIAGNOSIS — I480 Paroxysmal atrial fibrillation: Secondary | ICD-10-CM

## 2019-12-05 DIAGNOSIS — Z79899 Other long term (current) drug therapy: Secondary | ICD-10-CM

## 2019-12-05 DIAGNOSIS — Z7901 Long term (current) use of anticoagulants: Secondary | ICD-10-CM | POA: Diagnosis not present

## 2019-12-05 DIAGNOSIS — I110 Hypertensive heart disease with heart failure: Secondary | ICD-10-CM | POA: Diagnosis not present

## 2019-12-05 NOTE — Patient Instructions (Signed)
Medication Instructions:  Your physician recommends that you continue on your current medications as directed. Please refer to the Current Medication list given to you today.  *If you need a refill on your cardiac medications before your next appointment, please call your pharmacy*   Lab Work: Your physician recommends that you return for lab work in: TODAY Flecainide level If you have labs (blood work) drawn today and your tests are completely normal, you will receive your results only by: MyChart Message (if you have MyChart) OR A paper copy in the mail If you have any lab test that is abnormal or we need to change your treatment, we will call you to review the results.   Testing/Procedures: None   Follow-Up: At [facility], you and your health needs are our priority.  As part of our continuing mission to provide you with exceptional heart care, we have created designated Provider Care Teams.  These Care Teams include your primary Cardiologist (physician) and Advanced Practice Providers (APPs -  Physician Assistants and Nurse Practitioners) who all work together to provide you with the care you need, when you need it.  We recommend signing up for the patient portal called "MyChart".  Sign up information is provided on this After Visit Summary.  MyChart is used to connect with patients for Virtual Visits (Telemedicine).  Patients are able to view lab/test results, encounter notes, upcoming appointments, etc.  Non-urgent messages can be sent to your provider as well.   To learn more about what you can do with MyChart, go to [email or web address].    Your next appointment:   6 month(s)  The format for your next appointment:   In Person  Provider:   Brian Munley, MD   Other Instructions   

## 2019-12-05 NOTE — Addendum Note (Signed)
Addended by: Delorse Limber I on: 12/05/2019 08:44 AM   Modules accepted: Orders

## 2019-12-07 LAB — FLECAINIDE LEVEL: Flecainide: 0.25 ug/mL (ref 0.20–1.00)

## 2019-12-09 ENCOUNTER — Telehealth: Payer: Self-pay

## 2019-12-09 NOTE — Telephone Encounter (Signed)
-----   Message from Baldo Daub, MD sent at 12/08/2019  2:27 PM EDT ----- Normal or stable result  Flecainide level is on target no change in treatment

## 2019-12-09 NOTE — Telephone Encounter (Signed)
Spoke with patient regarding results and recommendation.  Patient verbalizes understanding and is agreeable to plan of care. Advised patient to call back with any issues or concerns.  

## 2019-12-29 ENCOUNTER — Other Ambulatory Visit: Payer: Self-pay | Admitting: Cardiology

## 2020-03-31 ENCOUNTER — Telehealth: Payer: Self-pay | Admitting: Cardiology

## 2020-03-31 NOTE — Telephone Encounter (Signed)
Patient states she usually has labwork done prior to her 6 month visit with Dr. Dulce Sellar. Visit is scheduled for December 10th. No orders for new labs in the system. Please call/advise on whether these labs are needed.   Thank you!

## 2020-04-06 ENCOUNTER — Telehealth: Payer: Self-pay | Admitting: Cardiology

## 2020-04-06 DIAGNOSIS — I480 Paroxysmal atrial fibrillation: Secondary | ICD-10-CM

## 2020-04-06 DIAGNOSIS — I10 Essential (primary) hypertension: Secondary | ICD-10-CM

## 2020-04-06 DIAGNOSIS — Z79899 Other long term (current) drug therapy: Secondary | ICD-10-CM

## 2020-04-06 NOTE — Telephone Encounter (Signed)
Digoxin, CMP, lipid and magnesium

## 2020-04-06 NOTE — Telephone Encounter (Signed)
Spoke to the patient just now and let her know that I have placed the orders for her to have this lab work completed. She thanks me for the call back and states she will come in 2-3 days prior to her appointment to get these done.

## 2020-04-06 NOTE — Telephone Encounter (Signed)
New message:     Patient calling stating she needs a orders for labs. Please call patient.

## 2020-04-08 ENCOUNTER — Telehealth: Payer: Self-pay | Admitting: Cardiology

## 2020-04-08 LAB — COMPREHENSIVE METABOLIC PANEL
ALT: 17 IU/L (ref 0–32)
AST: 23 IU/L (ref 0–40)
Albumin/Globulin Ratio: 1.9 (ref 1.2–2.2)
Albumin: 4.1 g/dL (ref 3.7–4.7)
Alkaline Phosphatase: 58 IU/L (ref 44–121)
BUN/Creatinine Ratio: 21 (ref 12–28)
BUN: 18 mg/dL (ref 8–27)
Bilirubin Total: 0.5 mg/dL (ref 0.0–1.2)
CO2: 27 mmol/L (ref 20–29)
Calcium: 9 mg/dL (ref 8.7–10.3)
Chloride: 99 mmol/L (ref 96–106)
Creatinine, Ser: 0.84 mg/dL (ref 0.57–1.00)
GFR calc Af Amer: 79 mL/min/{1.73_m2} (ref >59)
GFR calc non Af Amer: 68 mL/min/{1.73_m2} (ref >59)
Globulin, Total: 2.2 g/dL (ref 1.5–4.5)
Glucose: 83 mg/dL (ref 65–99)
Potassium: 4.1 mmol/L (ref 3.5–5.2)
Sodium: 139 mmol/L (ref 134–144)
Total Protein: 6.3 g/dL (ref 6.0–8.5)

## 2020-04-08 LAB — LIPID PANEL
Chol/HDL Ratio: 2.8 ratio (ref 0.0–4.4)
Cholesterol, Total: 228 mg/dL — ABNORMAL HIGH (ref 100–199)
HDL: 81 mg/dL (ref >39)
LDL Chol Calc (NIH): 131 mg/dL — ABNORMAL HIGH (ref 0–99)
Triglycerides: 93 mg/dL (ref 0–149)
VLDL Cholesterol Cal: 16 mg/dL (ref 5–40)

## 2020-04-08 LAB — DIGOXIN LEVEL: Digoxin, Serum: 0.4 ng/mL — ABNORMAL LOW (ref 0.5–0.9)

## 2020-04-08 LAB — MAGNESIUM: Magnesium: 2.1 mg/dL (ref 1.6–2.3)

## 2020-04-08 NOTE — Telephone Encounter (Signed)
Patient returning call for lab results. 

## 2020-04-08 NOTE — Telephone Encounter (Signed)
The patient has been notified of the result and verbalized understanding.  All questions (if any) were answered. Leanord Hawking, RN 04/08/2020 1:30 PM

## 2020-04-16 NOTE — Progress Notes (Signed)
Cardiology Office Note:    Date:  04/17/2020   ID:  TOIA MICALE, DOB 01-19-1946, MRN [id number]  PCP:  Olive Bass, MD  Cardiologist:  Norman Herrlich, MD    Referring MD: Olive Bass, MD    ASSESSMENT:    1. Paroxysmal atrial fibrillation (HCC)   2. High risk medication use   3. Long term current use of anticoagulant therapy   4. Hypertensive heart disease with congestive heart failure, unspecified heart failure type (HCC)   5. Mixed hyperlipidemia    PLAN:    In order of problems listed above:  1. Stable she is maintaining sinus rhythm on low-dose flecainide and remains anticoagulated. 2. She has no evidence of toxicity by EKG continue current dose flecainide 3. Continue low-dose digoxin 4. Stable at target continue ARB 5. Continue observation at this time diet unfortunately unable to exercise with back pain   Next appointment: 6 months   Medication Adjustments/Labs and Tests Ordered: Current medicines are reviewed at length with the patient today.  Concerns regarding medicines are outlined above.  Orders Placed This Encounter  Procedures  . EKG 12-Lead   No orders of the defined types were placed in this encounter.   No chief complaint on file.   History of Present Illness:    Leah Simmons is a 74 y.o. female with a hx of paroxysmal atrial fibrillation anticoagulated on flecainide and digoxin and hypertensive heart disease last seen 12/05/2019. Compliance with diet, lifestyle and medications: Yes  In general she is doing well tolerates flecainide she has had no breakthrough arrhythmia or side effects.  Her biggest problem is chronic back pain.  No edema shortness of breath orthopnea syncope and no bleeding complication of her anticoagulant.  Reviewed reviewed her lipids she is not in a high risk group she has a very high HDL and after discussion were not, institute lipid-lowering treatment. Past Medical History:  Diagnosis Date  . Allergic  rhinitis 09/06/2015  . Anxiety 09/06/2015  . Atrophic vaginitis 02/08/2016  . Benign hypertension 09/06/2015  . Chronic atrial fibrillation (HCC) 07/27/2015   Overview:  2003: dx  . Chronic rheumatic arthritis (HCC) 02/08/2016  . Congestive heart failure (CHF) (HCC) 04/19/2016  . Essential hypertension 11/07/2014  . GERD (gastroesophageal reflux disease) 02/08/2016  . Hypertensive heart disease 09/06/2015  . Long term current use of anticoagulant therapy 07/27/2015  . Mixed hyperlipidemia 11/07/2014  . Osteoporosis 02/08/2016   Overview:  2002: -2.0 2012: -1.4 2014: -1.9 2017: -1.9 2017: fx humerus  . Paroxysmal atrial fibrillation (HCC) 07/27/2015   Overview:  2003: dx  . Screening for breast cancer 10/19/2016  . Screening for cervical cancer 10/19/2016  . Screening for colon cancer 10/19/2016  . Screening for diabetes mellitus (DM) 10/19/2016  . Sick sinus syndrome (HCC) 09/06/2015  . TIA (transient ischemic attack) 09/06/2015   Overview:  2003: right face sensation changes  . Wellness examination 10/19/2016    Past Surgical History:  Procedure Laterality Date  . LAPAROSCOPIC CHOLECYSTECTOMY    . TONSILLECTOMY      Current Medications: Current Meds  Medication Sig  . B Complex Vitamins (VITAMIN B COMPLEX PO) Take 1 tablet by mouth daily.  . Calcium Carb-Cholecalciferol (CALCIUM-VITAMIN D) 500-200 MG-UNIT tablet Take 1 tablet by mouth daily.  . candesartan (ATACAND) 32 MG tablet TAKE 1/2 TABLET(16 MG) BY MOUTH DAILY  . Cholecalciferol (VITAMIN D3) 2000 units capsule Take 2,000 Units by mouth daily.  Marland Kitchen conjugated estrogens (PREMARIN) vaginal cream Place  vaginally. Insert 0.5 g into the vagina two times per week  . cycloSPORINE (RESTASIS) 0.05 % ophthalmic emulsion Place 1 drop into both eyes daily.  Marland Kitchen DIGOX 125 MCG tablet Take 1 tablet (125 mcg total) by mouth every other day.  . flecainide (TAMBOCOR) 50 MG tablet Take 1 tablet (50 mg total) by mouth 2 (two) times daily.  Marland Kitchen loratadine  (CLARITIN) 10 MG tablet Take 10 mg by mouth daily.  . Magnesium Oxide 140 MG CAPS Take 140 mg by mouth daily.  . mometasone (NASONEX) 50 MCG/ACT nasal spray Place 2 sprays into the nose daily.  Marland Kitchen omeprazole (PRILOSEC) 20 MG capsule Take 20 mg by mouth daily as needed for indigestion.  Marland Kitchen PARoxetine (PAXIL) 10 MG tablet Take 10 mg by mouth daily.  . potassium chloride SA (KLOR-CON) 20 MEQ tablet TAKE 1 TABLET(20 MEQ) BY MOUTH TWICE DAILY  . traZODone (DESYREL) 50 MG tablet Take 50 mg by mouth at bedtime.  Marland Kitchen warfarin (COUMADIN) 3 MG tablet 1 tablet for 3 days in a row then 1/2 tablet     Allergies:   Clindamycin, Epinephrine, Levofloxacin, Moxifloxacin, Penicillins, Pravastatin, Sulfa antibiotics, Clarithromycin, and Other   Social History   Socioeconomic History  . Marital status: Widowed    Spouse name: Not on file  . Number of children: Not on file  . Years of education: Not on file  . Highest education level: Not on file  Occupational History  . Not on file  Tobacco Use  . Smoking status: Never Smoker  . Smokeless tobacco: Never Used  Vaping Use  . Vaping Use: Never used  Substance and Sexual Activity  . Alcohol use: No  . Drug use: No  . Sexual activity: Not on file  Other Topics Concern  . Not on file  Social History Narrative  . Not on file   Social Determinants of Health   Financial Resource Strain: Not on file  Food Insecurity: Not on file  Transportation Needs: Not on file  Physical Activity: Not on file  Stress: Not on file  Social Connections: Not on file     Family History: The patient's family history includes Osteoarthritis in her mother; Rheum arthritis in her mother; Stroke in her mother. ROS:   Please see the history of present illness.    All other systems reviewed and are negative.  EKGs/Labs/Other Studies Reviewed:    The following studies were reviewed today:  EKG:  EKG ordered today and personally reviewed.  The ekg ordered today  demonstrates sinus rhythm right bundle branch block  Recent Labs: 04/07/2020: ALT 17; BUN 18; Creatinine, Ser 0.84; Magnesium 2.1; Potassium 4.1; Sodium 139  Recent Lipid Panel    Component Value Date/Time   CHOL 228 (H) 04/07/2020 0807   TRIG 93 04/07/2020 0807   HDL 81 04/07/2020 0807   CHOLHDL 2.8 04/07/2020 0807   LDLCALC 131 (H) 04/07/2020 0807    Physical Exam:    VS:  BP 114/64   Pulse 61   Ht 5' 1.5" (1.562 m)   Wt 109 lb 3.2 oz (49.5 kg)   BMI 20.30 kg/m     Wt Readings from Last 3 Encounters:  04/17/20 109 lb 3.2 oz (49.5 kg)  12/05/19 105 lb 6.4 oz (47.8 kg)  11/14/19 107 lb (48.5 kg)     GEN:  Well nourished, well developed in no acute distress HEENT: Normal NECK: No JVD; No carotid bruits LYMPHATICS: No lymphadenopathy CARDIAC: RRR, no murmurs, rubs, gallops RESPIRATORY:  Clear to auscultation without rales, wheezing or rhonchi  ABDOMEN: Soft, non-tender, non-distended MUSCULOSKELETAL:  No edema; No deformity  SKIN: Warm and dry NEUROLOGIC:  Alert and oriented x 3 PSYCHIATRIC:  Normal affect    Signed, Norman Herrlich, MD  04/17/2020 8:22 AM    [facility]

## 2020-04-17 ENCOUNTER — Other Ambulatory Visit: Payer: Self-pay

## 2020-04-17 ENCOUNTER — Encounter: Payer: Self-pay | Admitting: Cardiology

## 2020-04-17 ENCOUNTER — Ambulatory Visit: Payer: Medicare PPO | Admitting: Cardiology

## 2020-04-17 VITALS — BP 114/64 | HR 61 | Ht 61.5 in | Wt 109.2 lb

## 2020-04-17 DIAGNOSIS — I110 Hypertensive heart disease with heart failure: Secondary | ICD-10-CM | POA: Diagnosis not present

## 2020-04-17 DIAGNOSIS — Z79899 Other long term (current) drug therapy: Secondary | ICD-10-CM | POA: Diagnosis not present

## 2020-04-17 DIAGNOSIS — I480 Paroxysmal atrial fibrillation: Secondary | ICD-10-CM

## 2020-04-17 DIAGNOSIS — Z7901 Long term (current) use of anticoagulants: Secondary | ICD-10-CM | POA: Diagnosis not present

## 2020-04-17 DIAGNOSIS — E782 Mixed hyperlipidemia: Secondary | ICD-10-CM

## 2020-04-17 NOTE — Patient Instructions (Signed)

## 2020-07-18 ENCOUNTER — Other Ambulatory Visit: Payer: Self-pay | Admitting: Cardiology

## 2020-07-20 NOTE — Telephone Encounter (Signed)
Digoxin approved and sent 

## 2020-07-21 DIAGNOSIS — I482 Chronic atrial fibrillation, unspecified: Secondary | ICD-10-CM | POA: Diagnosis not present

## 2020-09-16 ENCOUNTER — Other Ambulatory Visit: Payer: Self-pay | Admitting: Cardiology

## 2020-09-17 DIAGNOSIS — N952 Postmenopausal atrophic vaginitis: Secondary | ICD-10-CM | POA: Diagnosis not present

## 2020-09-17 DIAGNOSIS — Z79899 Other long term (current) drug therapy: Secondary | ICD-10-CM | POA: Diagnosis not present

## 2020-09-17 DIAGNOSIS — N3010 Interstitial cystitis (chronic) without hematuria: Secondary | ICD-10-CM | POA: Diagnosis not present

## 2020-09-22 DIAGNOSIS — I482 Chronic atrial fibrillation, unspecified: Secondary | ICD-10-CM | POA: Diagnosis not present

## 2020-10-06 ENCOUNTER — Telehealth: Payer: Self-pay | Admitting: Cardiology

## 2020-10-06 DIAGNOSIS — I110 Hypertensive heart disease with heart failure: Secondary | ICD-10-CM

## 2020-10-06 DIAGNOSIS — Z79899 Other long term (current) drug therapy: Secondary | ICD-10-CM

## 2020-10-06 DIAGNOSIS — I480 Paroxysmal atrial fibrillation: Secondary | ICD-10-CM

## 2020-10-06 NOTE — Telephone Encounter (Signed)
Spoke with the patient just now and let her know that I have placed the order for these labs and that she can come in to get these drawn at her earliest convenience. She verbalizes understanding.    Encouraged patient to call back with any questions or concerns.

## 2020-10-06 NOTE — Telephone Encounter (Signed)
Patient is requesting orders to have lab work completed prior to her appointment on 10/14/20 with Dr. Dulce Sellar.

## 2020-10-06 NOTE — Telephone Encounter (Signed)
CMP digoxin level magnesium and lipid profile

## 2020-10-09 DIAGNOSIS — Z79899 Other long term (current) drug therapy: Secondary | ICD-10-CM | POA: Diagnosis not present

## 2020-10-09 DIAGNOSIS — I480 Paroxysmal atrial fibrillation: Secondary | ICD-10-CM | POA: Diagnosis not present

## 2020-10-09 DIAGNOSIS — I110 Hypertensive heart disease with heart failure: Secondary | ICD-10-CM | POA: Diagnosis not present

## 2020-10-10 LAB — COMPREHENSIVE METABOLIC PANEL
ALT: 14 IU/L (ref 0–32)
AST: 19 IU/L (ref 0–40)
Albumin/Globulin Ratio: 2.3 — ABNORMAL HIGH (ref 1.2–2.2)
Albumin: 4.4 g/dL (ref 3.7–4.7)
Alkaline Phosphatase: 57 IU/L (ref 44–121)
BUN/Creatinine Ratio: 19 (ref 12–28)
BUN: 13 mg/dL (ref 8–27)
Bilirubin Total: 0.4 mg/dL (ref 0.0–1.2)
CO2: 27 mmol/L (ref 20–29)
Calcium: 9.1 mg/dL (ref 8.7–10.3)
Chloride: 101 mmol/L (ref 96–106)
Creatinine, Ser: 0.7 mg/dL (ref 0.57–1.00)
Globulin, Total: 1.9 g/dL (ref 1.5–4.5)
Glucose: 84 mg/dL (ref 65–99)
Potassium: 4.3 mmol/L (ref 3.5–5.2)
Sodium: 139 mmol/L (ref 134–144)
Total Protein: 6.3 g/dL (ref 6.0–8.5)
eGFR: 90 mL/min/{1.73_m2} (ref >59)

## 2020-10-10 LAB — LIPID PANEL
Chol/HDL Ratio: 2.6 ratio (ref 0.0–4.4)
Cholesterol, Total: 188 mg/dL (ref 100–199)
HDL: 71 mg/dL (ref >39)
LDL Chol Calc (NIH): 104 mg/dL — ABNORMAL HIGH (ref 0–99)
Triglycerides: 72 mg/dL (ref 0–149)
VLDL Cholesterol Cal: 13 mg/dL (ref 5–40)

## 2020-10-10 LAB — DIGOXIN LEVEL: Digoxin, Serum: <0.4 ng/mL — ABNORMAL LOW (ref 0.5–0.9)

## 2020-10-10 LAB — MAGNESIUM: Magnesium: 2.2 mg/dL (ref 1.6–2.3)

## 2020-10-13 NOTE — Progress Notes (Signed)
Cardiology Office Note:    Date:  10/14/2020   ID:  Leah Simmons, DOB [birth year], MRN [id number]  PCP:  Olive Bass, MD  Cardiologist:  Norman Herrlich, MD    Referring MD: Olive Bass, MD    ASSESSMENT:    1. Paroxysmal atrial fibrillation (HCC)   2. Long term current use of anticoagulant therapy   3. High risk medication use   4. Hypertensive heart disease with congestive heart failure, unspecified heart failure type (HCC)   5. Mixed hyperlipidemia    PLAN:    In order of problems listed above:  1. She continues to do well maintaining sinus rhythm on very low-dose flecainide digoxin and remains anticoagulated with warfarin. 2. Stable no evidence of flecainide or digoxin toxicity her digoxin level is less than 1 and that is the goal for treatment 3. Stable at target continue current treatment low-dose candesartan 4. Improved she attributes it to over-the-counter aloe vera   Next appointment: 6 months   Medication Adjustments/Labs and Tests Ordered: Current medicines are reviewed at length with the patient today.  Concerns regarding medicines are outlined above.  Orders Placed This Encounter  Procedures  . EKG 12-Lead   No orders of the defined types were placed in this encounter.   Chief Complaint  Patient presents with  . Follow-up    History of Present Illness:    Leah Simmons is a 75 y.o. female with a hx of paroxysmal atrial fibrillation maintaining sinus rhythm on low-dose flecainide and anticoagulated hypertensive heart disease  and hyperlipidemia last seen 04/17/2020.  Compliance with diet, lifestyle and medications: Yes  She continues to do well She is now taking over-the-counter aloe vera for cystitis and attributes her improvement in lipids to this over-the-counter medication. No breakthrough episodes of atrial fibrillation no side effects for flecainide and she tolerates her anticoagulant without bleeding. She has had no chest pain  palpitation edema or shortness of breath. She is on digoxin with a low level and is not having nausea vomiting or anorexia. Past Medical History:  Diagnosis Date  . Allergic rhinitis 09/06/2015  . Anxiety 09/06/2015  . Atrophic vaginitis 02/08/2016  . Benign hypertension 09/06/2015  . Chronic atrial fibrillation (HCC) 07/27/2015   Overview:  2003: dx  . Chronic rheumatic arthritis (HCC) 02/08/2016  . Congestive heart failure (CHF) (HCC) 04/19/2016  . Essential hypertension 11/07/2014  . GERD (gastroesophageal reflux disease) 02/08/2016  . Hypertensive heart disease 09/06/2015  . Long term current use of anticoagulant therapy 07/27/2015  . Mixed hyperlipidemia 11/07/2014  . Osteoporosis 02/08/2016   Overview:  2002: -2.0 2012: -1.4 2014: -1.9 2017: -1.9 2017: fx humerus  . Paroxysmal atrial fibrillation (HCC) 07/27/2015   Overview:  2003: dx  . Screening for breast cancer 10/19/2016  . Screening for cervical cancer 10/19/2016  . Screening for colon cancer 10/19/2016  . Screening for diabetes mellitus (DM) 10/19/2016  . Sick sinus syndrome (HCC) 09/06/2015  . TIA (transient ischemic attack) 09/06/2015   Overview:  2003: right face sensation changes  . Wellness examination 10/19/2016    Past Surgical History:  Procedure Laterality Date  . LAPAROSCOPIC CHOLECYSTECTOMY    . TONSILLECTOMY      Current Medications: Current Meds  Medication Sig  . ALOE VERA PO Take 800 mg by mouth daily.  . B Complex Vitamins (VITAMIN B COMPLEX PO) Take 1 tablet by mouth daily.  . Calcium Carb-Cholecalciferol (CALCIUM-VITAMIN D) 500-200 MG-UNIT tablet Take 1 tablet by mouth daily.  Marland Kitchen  candesartan (ATACAND) 32 MG tablet TAKE 1/2 TABLET(16 MG) BY MOUTH DAILY  . Cholecalciferol (VITAMIN D3) 2000 units capsule Take 2,000 Units by mouth daily.  Marland Kitchen conjugated estrogens (PREMARIN) vaginal cream Place vaginally. Insert 0.5 g into the vagina two times per week  . cycloSPORINE (RESTASIS) 0.05 % ophthalmic emulsion Place 1 drop  into both eyes daily.  . digoxin (LANOXIN) 0.125 MG tablet TAKE 1 TABLET(125 MCG) BY MOUTH EVERY OTHER DAY  . diltiazem (CARDIZEM) 30 MG tablet Take 1 tablet (30 mg total) by mouth 4 (four) times daily as needed.  . flecainide (TAMBOCOR) 50 MG tablet Take 1 tablet (50 mg total) by mouth 2 (two) times daily.  Marland Kitchen loratadine (CLARITIN) 10 MG tablet Take 10 mg by mouth daily.  . Magnesium Oxide 140 MG CAPS Take 140 mg by mouth daily.  . mometasone (NASONEX) 50 MCG/ACT nasal spray Place 2 sprays into the nose daily.  Marland Kitchen omeprazole (PRILOSEC) 20 MG capsule Take 20 mg by mouth daily as needed for indigestion.  Marland Kitchen PARoxetine (PAXIL) 10 MG tablet Take 10 mg by mouth daily.  . potassium chloride SA (KLOR-CON) 20 MEQ tablet TAKE 1 TABLET(20 MEQ) BY MOUTH TWICE DAILY  . traZODone (DESYREL) 50 MG tablet Take 50 mg by mouth at bedtime.  Marland Kitchen warfarin (COUMADIN) 3 MG tablet 1 tablet for 3 days in a row then 1/2 tablet     Allergies:   Clindamycin, Epinephrine, Levofloxacin, Moxifloxacin, Penicillins, Pravastatin, Sulfa antibiotics, Clarithromycin, and Other   Social History   Socioeconomic History  . Marital status: Widowed    Spouse name: Not on file  . Number of children: Not on file  . Years of education: Not on file  . Highest education level: Not on file  Occupational History  . Not on file  Tobacco Use  . Smoking status: Never Smoker  . Smokeless tobacco: Never Used  Vaping Use  . Vaping Use: Never used  Substance and Sexual Activity  . Alcohol use: No  . Drug use: No  . Sexual activity: Not on file  Other Topics Concern  . Not on file  Social History Narrative  . Not on file   Social Determinants of Health   Financial Resource Strain: Not on file  Food Insecurity: Not on file  Transportation Needs: Not on file  Physical Activity: Not on file  Stress: Not on file  Social Connections: Not on file     Family History: The patient's family history includes Osteoarthritis in her  mother; Rheum arthritis in her mother; Stroke in her mother. ROS:   Please see the history of present illness.    All other systems reviewed and are negative.  EKGs/Labs/Other Studies Reviewed:    The following studies were reviewed today:  EKG:  EKG ordered today and personally reviewed.  The ekg ordered today demonstrates sinus bradycardia 55 bpm otherwise normal EKG no findings of 1C antiarrhythmic drug toxicity  Recent Labs: 10/09/2020: ALT 14; BUN 13; Creatinine, Ser 0.70; Magnesium 2.2; Potassium 4.3; Sodium 139  Recent Lipid Panel    Component Value Date/Time   CHOL 188 10/09/2020 0807   TRIG 72 10/09/2020 0807   HDL 71 10/09/2020 0807   CHOLHDL 2.6 10/09/2020 0807   LDLCALC 104 (H) 10/09/2020 0807    Physical Exam:    VS:  BP 94/60 (BP Location: Left Arm, Patient Position: Sitting, Cuff Size: Small)   Pulse (!) 55   Ht 5' 1.5" (1.562 m)   Wt 108 lb (49  kg)   SpO2 97%   BMI 20.08 kg/m     Wt Readings from Last 3 Encounters:  10/14/20 108 lb (49 kg)  04/17/20 109 lb 3.2 oz (49.5 kg)  12/05/19 105 lb 6.4 oz (47.8 kg)     GEN:  Well nourished, well developed in no acute distress HEENT: Normal NECK: No JVD; No carotid bruits LYMPHATICS: No lymphadenopathy CARDIAC: She is on digoxin low-dose flecainide and anticoagulated RRR, no murmurs, rubs, gallops RESPIRATORY:  Clear to auscultation without rales, wheezing or rhonchi  ABDOMEN: Soft, non-tender, non-distended MUSCULOSKELETAL:  No edema; No deformity  SKIN: Warm and dry NEUROLOGIC:  Alert and oriented x 3 PSYCHIATRIC:  Normal affect    Signed, Norman Herrlich, MD  10/14/2020 8:41 AM    [facility]

## 2020-10-14 ENCOUNTER — Encounter: Payer: Self-pay | Admitting: Cardiology

## 2020-10-14 ENCOUNTER — Other Ambulatory Visit: Payer: Self-pay

## 2020-10-14 ENCOUNTER — Ambulatory Visit (INDEPENDENT_AMBULATORY_CARE_PROVIDER_SITE_OTHER): Payer: BC Managed Care – PPO | Admitting: Cardiology

## 2020-10-14 VITALS — BP 108/60 | HR 55 | Ht 61.5 in | Wt 108.0 lb

## 2020-10-14 DIAGNOSIS — I480 Paroxysmal atrial fibrillation: Secondary | ICD-10-CM | POA: Diagnosis not present

## 2020-10-14 DIAGNOSIS — I110 Hypertensive heart disease with heart failure: Secondary | ICD-10-CM

## 2020-10-14 DIAGNOSIS — Z79899 Other long term (current) drug therapy: Secondary | ICD-10-CM

## 2020-10-14 DIAGNOSIS — E782 Mixed hyperlipidemia: Secondary | ICD-10-CM

## 2020-10-14 DIAGNOSIS — Z7901 Long term (current) use of anticoagulants: Secondary | ICD-10-CM

## 2020-10-14 NOTE — Patient Instructions (Signed)

## 2020-10-21 ENCOUNTER — Other Ambulatory Visit: Payer: Self-pay | Admitting: Family Medicine

## 2020-10-21 DIAGNOSIS — Z1231 Encounter for screening mammogram for malignant neoplasm of breast: Secondary | ICD-10-CM

## 2020-10-30 ENCOUNTER — Other Ambulatory Visit: Payer: Self-pay | Admitting: Cardiology

## 2020-10-30 NOTE — Telephone Encounter (Signed)
Rx approved and sent 

## 2020-11-02 DIAGNOSIS — B9789 Other viral agents as the cause of diseases classified elsewhere: Secondary | ICD-10-CM

## 2020-11-02 DIAGNOSIS — U071 COVID-19: Secondary | ICD-10-CM | POA: Diagnosis not present

## 2020-11-02 HISTORY — DX: Other viral agents as the cause of diseases classified elsewhere: B97.89

## 2020-11-30 DIAGNOSIS — L821 Other seborrheic keratosis: Secondary | ICD-10-CM | POA: Diagnosis not present

## 2020-11-30 DIAGNOSIS — L918 Other hypertrophic disorders of the skin: Secondary | ICD-10-CM | POA: Diagnosis not present

## 2020-11-30 DIAGNOSIS — L820 Inflamed seborrheic keratosis: Secondary | ICD-10-CM | POA: Diagnosis not present

## 2020-12-01 DIAGNOSIS — N952 Postmenopausal atrophic vaginitis: Secondary | ICD-10-CM | POA: Diagnosis not present

## 2020-12-01 DIAGNOSIS — Z1231 Encounter for screening mammogram for malignant neoplasm of breast: Secondary | ICD-10-CM | POA: Diagnosis not present

## 2020-12-01 DIAGNOSIS — F419 Anxiety disorder, unspecified: Secondary | ICD-10-CM | POA: Diagnosis not present

## 2020-12-01 DIAGNOSIS — Z7901 Long term (current) use of anticoagulants: Secondary | ICD-10-CM | POA: Diagnosis not present

## 2020-12-01 DIAGNOSIS — Z124 Encounter for screening for malignant neoplasm of cervix: Secondary | ICD-10-CM | POA: Diagnosis not present

## 2020-12-01 DIAGNOSIS — I482 Chronic atrial fibrillation, unspecified: Secondary | ICD-10-CM | POA: Diagnosis not present

## 2020-12-01 DIAGNOSIS — J0120 Acute ethmoidal sinusitis, unspecified: Secondary | ICD-10-CM | POA: Diagnosis not present

## 2020-12-01 DIAGNOSIS — Z1211 Encounter for screening for malignant neoplasm of colon: Secondary | ICD-10-CM | POA: Diagnosis not present

## 2020-12-01 DIAGNOSIS — I10 Essential (primary) hypertension: Secondary | ICD-10-CM | POA: Diagnosis not present

## 2020-12-01 DIAGNOSIS — Z0000 Encounter for general adult medical examination without abnormal findings: Secondary | ICD-10-CM | POA: Diagnosis not present

## 2020-12-01 DIAGNOSIS — J018 Other acute sinusitis: Secondary | ICD-10-CM | POA: Diagnosis not present

## 2020-12-10 ENCOUNTER — Inpatient Hospital Stay: Admission: RE | Admit: 2020-12-10 | Payer: PPO | Source: Ambulatory Visit

## 2020-12-17 ENCOUNTER — Other Ambulatory Visit: Payer: Self-pay | Admitting: Cardiology

## 2021-01-28 ENCOUNTER — Other Ambulatory Visit: Payer: Self-pay

## 2021-01-28 ENCOUNTER — Ambulatory Visit
Admission: RE | Admit: 2021-01-28 | Discharge: 2021-01-28 | Disposition: A | Discharge status: Home / Self Care | Payer: PPO | Source: Ambulatory Visit | Attending: Family Medicine | Admitting: Family Medicine

## 2021-01-28 DIAGNOSIS — Z1231 Encounter for screening mammogram for malignant neoplasm of breast: Secondary | ICD-10-CM | POA: Diagnosis not present

## 2021-01-29 DIAGNOSIS — I482 Chronic atrial fibrillation, unspecified: Secondary | ICD-10-CM | POA: Diagnosis not present

## 2021-02-19 DIAGNOSIS — Z23 Encounter for immunization: Secondary | ICD-10-CM | POA: Diagnosis not present

## 2021-03-23 DIAGNOSIS — N3010 Interstitial cystitis (chronic) without hematuria: Secondary | ICD-10-CM | POA: Diagnosis not present

## 2021-03-23 DIAGNOSIS — N952 Postmenopausal atrophic vaginitis: Secondary | ICD-10-CM | POA: Diagnosis not present

## 2021-03-26 DIAGNOSIS — I482 Chronic atrial fibrillation, unspecified: Secondary | ICD-10-CM | POA: Diagnosis not present

## 2021-04-16 ENCOUNTER — Telehealth: Payer: Self-pay | Admitting: Cardiology

## 2021-04-16 ENCOUNTER — Other Ambulatory Visit: Payer: Self-pay

## 2021-04-16 DIAGNOSIS — E782 Mixed hyperlipidemia: Secondary | ICD-10-CM

## 2021-04-16 DIAGNOSIS — R7889 Finding of other specified substances, not normally found in blood: Secondary | ICD-10-CM

## 2021-04-16 DIAGNOSIS — I480 Paroxysmal atrial fibrillation: Secondary | ICD-10-CM

## 2021-04-16 NOTE — Telephone Encounter (Signed)
Patient states she needs lab orders for her 6 month check up.

## 2021-04-19 NOTE — Telephone Encounter (Signed)
Pt advised that labs have been placed.

## 2021-04-20 DIAGNOSIS — E782 Mixed hyperlipidemia: Secondary | ICD-10-CM | POA: Diagnosis not present

## 2021-04-20 DIAGNOSIS — I480 Paroxysmal atrial fibrillation: Secondary | ICD-10-CM | POA: Diagnosis not present

## 2021-04-20 DIAGNOSIS — R7889 Finding of other specified substances, not normally found in blood: Secondary | ICD-10-CM | POA: Diagnosis not present

## 2021-04-21 LAB — LIPID PANEL
Chol/HDL Ratio: 2.8 ratio (ref 0.0–4.4)
Cholesterol, Total: 200 mg/dL — ABNORMAL HIGH (ref 100–199)
HDL: 71 mg/dL (ref >39)
LDL Chol Calc (NIH): 114 mg/dL — ABNORMAL HIGH (ref 0–99)
Triglycerides: 85 mg/dL (ref 0–149)
VLDL Cholesterol Cal: 15 mg/dL (ref 5–40)

## 2021-04-21 LAB — COMPREHENSIVE METABOLIC PANEL
ALT: 17 IU/L (ref 0–32)
AST: 23 IU/L (ref 0–40)
Albumin/Globulin Ratio: 2 (ref 1.2–2.2)
Albumin: 4.3 g/dL (ref 3.7–4.7)
Alkaline Phosphatase: 58 IU/L (ref 44–121)
BUN/Creatinine Ratio: 17 (ref 12–28)
BUN: 13 mg/dL (ref 8–27)
Bilirubin Total: 0.5 mg/dL (ref 0.0–1.2)
CO2: 27 mmol/L (ref 20–29)
Calcium: 8.9 mg/dL (ref 8.7–10.3)
Chloride: 99 mmol/L (ref 96–106)
Creatinine, Ser: 0.75 mg/dL (ref 0.57–1.00)
Globulin, Total: 2.1 g/dL (ref 1.5–4.5)
Glucose: 76 mg/dL (ref 70–99)
Potassium: 4.4 mmol/L (ref 3.5–5.2)
Sodium: 138 mmol/L (ref 134–144)
Total Protein: 6.4 g/dL (ref 6.0–8.5)
eGFR: 82 mL/min/{1.73_m2} (ref >59)

## 2021-04-21 LAB — DIGOXIN LEVEL: Digoxin, Serum: <0.4 ng/mL — ABNORMAL LOW (ref 0.5–0.9)

## 2021-04-21 LAB — MAGNESIUM: Magnesium: 2 mg/dL (ref 1.6–2.3)

## 2021-04-24 NOTE — Progress Notes (Signed)
Cardiology Office Note:    Date:  04/27/2021   ID:  Leah Simmons, DOB Oct 13, 1945, MRN [id number]  PCP:  Olive Bass, MD  Cardiologist:  Norman Herrlich, MD    Referring MD: Olive Bass, MD    ASSESSMENT:    1. Paroxysmal atrial fibrillation (HCC)   2. High risk medication use   3. Long term current use of anticoagulant therapy   4. Serum digoxin level within therapeutic range   5. Hypertensive heart disease with congestive heart failure, unspecified heart failure type (HCC)    PLAN:    In order of problems listed above:  She continues to do well with atrial fibrillation maintaining sinus rhythm on low-dose flecainide without toxicity she will continue her anticoagulant and digoxin with a therapeutic level less than 1. Continue digoxin flecainide anticoagulant BP at target continue current treatment including her low-dose calcium channel blocker   Next appointment: 6 months her request we will check labs 1 week before visit   Medication Adjustments/Labs and Tests Ordered: Current medicines are reviewed at length with the patient today.  Concerns regarding medicines are outlined above.  Orders Placed This Encounter  Procedures   Comprehensive metabolic panel   Digoxin level   Lipid panel   Magnesium   EKG 12-Lead   No orders of the defined types were placed in this encounter.   Chief Complaint  Patient presents with   Follow-up   Atrial Fibrillation   History of Present Illness:    Leah Simmons is a 75 y.o. female with a hx of  paroxysmal atrial fibrillation maintaining sinus rhythm on low-dose flecainide and anticoagulated hypertensive heart disease  and hyperlipidemia   last seen on 10/15/2020. Compliance with diet, lifestyle and medications: Yes  She continues to do well has a good quality of life vigorous and active is asymptomatic with atrial fibrillation she has had no chest pain palpitation syncope and no bleeding from her anticoagulant. She  uses branded generic tamsulosin follow-up with her PCP and is interested in point-of-care home INR I reviewed her labs with overall reassuring She is not on lipid-lowering therapy Past Medical History:  Diagnosis Date   Allergic rhinitis 09/06/2015   Anxiety 09/06/2015   Atrophic vaginitis 02/08/2016   Benign hypertension 09/06/2015   Chronic atrial fibrillation (HCC) 07/27/2015   Overview:  2003: dx   Chronic rheumatic arthritis (HCC) 02/08/2016   Congestive heart failure (CHF) (HCC) 04/19/2016   Essential hypertension 11/07/2014   GERD (gastroesophageal reflux disease) 02/08/2016   High risk medication use 07/03/2017   Hypertensive heart disease 09/06/2015   Long term current use of anticoagulant therapy 07/27/2015   Mixed hyperlipidemia 11/07/2014   Osteoporosis 02/08/2016   Overview:  2002: -2.0 2012: -1.4 2014: -1.9 2017: -1.9 2017: fx humerus   Paroxysmal atrial fibrillation (HCC) 07/27/2015   Overview:  2003: dx   Screening for breast cancer 10/19/2016   Screening for cervical cancer 10/19/2016   Screening for colon cancer 10/19/2016   Screening for diabetes mellitus (DM) 10/19/2016   Sensation of pressure in bladder area 09/19/2017   2019   Sick sinus syndrome (HCC) 09/06/2015   Sinusitis 09/22/2015   2017, 2017, 2017   TIA (transient ischemic attack) 09/06/2015   Overview:  2003: right face sensation changes   Wellness examination 10/19/2016    Past Surgical History:  Procedure Laterality Date   LAPAROSCOPIC CHOLECYSTECTOMY     TONSILLECTOMY      Current Medications: Current Meds  Medication Sig  ALOE VERA PO Take 800 mg by mouth daily.   B Complex Vitamins (VITAMIN B COMPLEX PO) Take 1 tablet by mouth daily.   Calcium Carb-Cholecalciferol (CALCIUM-VITAMIN D) 500-200 MG-UNIT tablet Take 1 tablet by mouth daily.   candesartan (ATACAND) 32 MG tablet TAKE 1/2 TABLET(16 MG) BY MOUTH DAILY   Cholecalciferol (VITAMIN D3) 2000 units capsule Take 2,000 Units by mouth daily.   conjugated  estrogens (PREMARIN) vaginal cream Place 0.5 g vaginally 2 (two) times a week.   cycloSPORINE (RESTASIS) 0.05 % ophthalmic emulsion Place 1 drop into both eyes daily.   digoxin (LANOXIN) 0.125 MG tablet TAKE 1 TABLET(125 MCG) BY MOUTH EVERY OTHER DAY   diltiazem (CARDIZEM) 30 MG tablet Take 30 mg by mouth 4 (four) times daily as needed for heart rate control.   flecainide (TAMBOCOR) 50 MG tablet TAKE 1 TABLET(50 MG) BY MOUTH TWICE DAILY   loratadine (CLARITIN) 10 MG tablet Take 10 mg by mouth daily.   Magnesium Oxide 140 MG CAPS Take 140 mg by mouth daily.   mometasone (NASONEX) 50 MCG/ACT nasal spray Place 2 sprays into the nose daily.   PARoxetine (PAXIL) 20 MG tablet Take 20 mg by mouth daily.   potassium chloride SA (KLOR-CON) 20 MEQ tablet TAKE 1 TABLET(20 MEQ) BY MOUTH TWICE DAILY   traZODone (DESYREL) 50 MG tablet Take 50 mg by mouth at bedtime.   warfarin (JANTOVEN) 3 MG tablet Take 3 mg by mouth 3 (three) times a week. Then takes 1.5 mg after 3 days of 3 mg     Allergies:   Clindamycin, Epinephrine, Levofloxacin, Moxifloxacin, Penicillins, Pravastatin, Sulfa antibiotics, Clarithromycin, and Other   Social History   Socioeconomic History   Marital status: Widowed    Spouse name: Not on file   Number of children: Not on file   Years of education: Not on file   Highest education level: Not on file  Occupational History   Not on file  Tobacco Use   Smoking status: Never   Smokeless tobacco: Never  Vaping Use   Vaping Use: Never used  Substance and Sexual Activity   Alcohol use: No   Drug use: No   Sexual activity: Not on file  Other Topics Concern   Not on file  Social History Narrative   Not on file   Social Determinants of Health   Financial Resource Strain: Not on file  Food Insecurity: Not on file  Transportation Needs: Not on file  Physical Activity: Not on file  Stress: Not on file  Social Connections: Not on file     Family History: The patient's family  history includes Osteoarthritis in her mother; Rheum arthritis in her mother; Stroke in her mother. ROS:   Please see the history of present illness.    All other systems reviewed and are negative.  EKGs/Labs/Other Studies Reviewed:    The following studies were reviewed today:  EKG:  EKG ordered today and personally reviewed.  The ekg ordered today demonstrates sinus rhythm narrow QRS 88 ms normal EKG  Recent Labs: 04/20/2021: ALT 17; BUN 13; Creatinine, Ser 0.75; Magnesium 2.0; Potassium 4.4; Sodium 138  Recent Lipid Panel    Component Value Date/Time   CHOL 200 (H) 04/20/2021 0817   TRIG 85 04/20/2021 0817   HDL 71 04/20/2021 0817   CHOLHDL 2.8 04/20/2021 0817   LDLCALC 114 (H) 04/20/2021 0817    Physical Exam:    VS:  BP 124/74    Pulse 64  Ht 5\' 1"  (1.549 m)    Wt 108 lb 3.2 oz (49.1 kg)    SpO2 97%    BMI 20.44 kg/m     Wt Readings from Last 3 Encounters:  04/27/21 108 lb 3.2 oz (49.1 kg)  10/14/20 108 lb (49 kg)  04/17/20 109 lb 3.2 oz (49.5 kg)     GEN:  Well nourished, well developed in no acute distress HEENT: Normal NECK: No JVD; No carotid bruits LYMPHATICS: No lymphadenopathy CARDIAC: RRR, no murmurs, rubs, gallops RESPIRATORY:  Clear to auscultation without rales, wheezing or rhonchi  ABDOMEN: Soft, non-tender, non-distended MUSCULOSKELETAL:  No edema; No deformity  SKIN: Warm and dry NEUROLOGIC:  Alert and oriented x 3 PSYCHIATRIC:  Normal affect    Signed, 14/10/21, MD  04/27/2021 1:09 PM    [facility]

## 2021-04-27 ENCOUNTER — Other Ambulatory Visit: Payer: Self-pay

## 2021-04-27 ENCOUNTER — Ambulatory Visit (INDEPENDENT_AMBULATORY_CARE_PROVIDER_SITE_OTHER): Payer: PPO | Admitting: Cardiology

## 2021-04-27 ENCOUNTER — Encounter: Payer: Self-pay | Admitting: Cardiology

## 2021-04-27 VITALS — BP 124/74 | HR 64 | Ht 61.0 in | Wt 108.2 lb

## 2021-04-27 DIAGNOSIS — I110 Hypertensive heart disease with heart failure: Secondary | ICD-10-CM | POA: Diagnosis not present

## 2021-04-27 DIAGNOSIS — Z7901 Long term (current) use of anticoagulants: Secondary | ICD-10-CM

## 2021-04-27 DIAGNOSIS — R7889 Finding of other specified substances, not normally found in blood: Secondary | ICD-10-CM | POA: Diagnosis not present

## 2021-04-27 DIAGNOSIS — I480 Paroxysmal atrial fibrillation: Secondary | ICD-10-CM | POA: Diagnosis not present

## 2021-04-27 DIAGNOSIS — Z79899 Other long term (current) drug therapy: Secondary | ICD-10-CM

## 2021-04-27 NOTE — Patient Instructions (Signed)
Medication Instructions:  Your physician recommends that you continue on your current medications as directed. Please refer to the Current Medication list given to you today.  *If you need a refill on your cardiac medications before your next appointment, please call your pharmacy*   Lab Work: Your physician recommends that you return for lab work in: TODAY CMP, Lipids, Digoxin, Magnesium If you have labs (blood work) drawn today and your tests are completely normal, you will receive your results only by: MyChart Message (if you have MyChart) OR A paper copy in the mail If you have any lab test that is abnormal or we need to change your treatment, we will call you to review the results.   Testing/Procedures: None   Follow-Up: At [facility], you and your health needs are our priority.  As part of our continuing mission to provide you with exceptional heart care, we have created designated Provider Care Teams.  These Care Teams include your primary Cardiologist (physician) and Advanced Practice Providers (APPs -  Physician Assistants and Nurse Practitioners) who all work together to provide you with the care you need, when you need it.  We recommend signing up for the patient portal called "MyChart".  Sign up information is provided on this After Visit Summary.  MyChart is used to connect with patients for Virtual Visits (Telemedicine).  Patients are able to view lab/test results, encounter notes, upcoming appointments, etc.  Non-urgent messages can be sent to your provider as well.   To learn more about what you can do with MyChart, go to [email or web address].    Your next appointment:   6 month(s)  The format for your next appointment:   In Person  Provider:   Norman Herrlich, MD    Other Instructions

## 2021-06-20 ENCOUNTER — Other Ambulatory Visit: Payer: Self-pay | Admitting: Cardiology

## 2021-07-19 DIAGNOSIS — S0033XA Contusion of nose, initial encounter: Secondary | ICD-10-CM | POA: Insufficient documentation

## 2021-07-19 DIAGNOSIS — S20219A Contusion of unspecified front wall of thorax, initial encounter: Secondary | ICD-10-CM | POA: Insufficient documentation

## 2021-07-19 HISTORY — DX: Contusion of nose, initial encounter: S00.33XA

## 2021-07-19 HISTORY — DX: Contusion of unspecified front wall of thorax, initial encounter: S20.219A

## 2021-07-28 ENCOUNTER — Other Ambulatory Visit: Payer: Self-pay | Admitting: Cardiology

## 2021-07-29 ENCOUNTER — Telehealth: Payer: Self-pay | Admitting: Cardiology

## 2021-07-29 NOTE — Telephone Encounter (Signed)
New Message: ? ? ?Patient said she received a call from the pharmacist that her Digoxin was denied. ?

## 2021-08-17 DIAGNOSIS — M419 Scoliosis, unspecified: Secondary | ICD-10-CM

## 2021-08-17 DIAGNOSIS — R399 Unspecified symptoms and signs involving the genitourinary system: Secondary | ICD-10-CM | POA: Insufficient documentation

## 2021-08-17 DIAGNOSIS — M519 Unspecified thoracic, thoracolumbar and lumbosacral intervertebral disc disorder: Secondary | ICD-10-CM

## 2021-08-17 DIAGNOSIS — R202 Paresthesia of skin: Secondary | ICD-10-CM

## 2021-08-17 HISTORY — DX: Scoliosis, unspecified: M41.9

## 2021-08-17 HISTORY — DX: Paresthesia of skin: R20.2

## 2021-08-17 HISTORY — DX: Unspecified thoracic, thoracolumbar and lumbosacral intervertebral disc disorder: M51.9

## 2021-08-17 HISTORY — DX: Unspecified symptoms and signs involving the genitourinary system: R39.9

## 2021-09-01 DIAGNOSIS — L304 Erythema intertrigo: Secondary | ICD-10-CM

## 2021-09-01 HISTORY — DX: Erythema intertrigo: L30.4

## 2021-09-13 DIAGNOSIS — L0293 Carbuncle, unspecified: Secondary | ICD-10-CM | POA: Insufficient documentation

## 2021-09-13 HISTORY — DX: Carbuncle, unspecified: L02.93

## 2021-09-21 DIAGNOSIS — K6289 Other specified diseases of anus and rectum: Secondary | ICD-10-CM | POA: Insufficient documentation

## 2021-09-21 HISTORY — DX: Other specified diseases of anus and rectum: K62.89

## 2021-10-12 ENCOUNTER — Telehealth: Payer: Self-pay | Admitting: Cardiology

## 2021-10-12 NOTE — Telephone Encounter (Signed)
Pt states that she would like and order to be put in for her to have lab work done before her scheduled appt on 10/26/21. Pt would like call if and when this is done. Please advise

## 2021-10-13 NOTE — Telephone Encounter (Signed)
Patient informed that orders has been placed and she will have labs drawn prior her scheduled appointment with Dr Bettina Gavia on 10/22/21

## 2021-10-18 ENCOUNTER — Telehealth: Payer: Self-pay | Admitting: *Deleted

## 2021-10-18 DIAGNOSIS — I110 Hypertensive heart disease with heart failure: Secondary | ICD-10-CM

## 2021-10-18 DIAGNOSIS — R7889 Finding of other specified substances, not normally found in blood: Secondary | ICD-10-CM

## 2021-10-18 DIAGNOSIS — E782 Mixed hyperlipidemia: Secondary | ICD-10-CM

## 2021-10-18 DIAGNOSIS — Z79899 Other long term (current) drug therapy: Secondary | ICD-10-CM

## 2021-10-18 DIAGNOSIS — I480 Paroxysmal atrial fibrillation: Secondary | ICD-10-CM

## 2021-10-18 DIAGNOSIS — Z7901 Long term (current) use of anticoagulants: Secondary | ICD-10-CM

## 2021-10-18 NOTE — Telephone Encounter (Signed)
Pt came in for labs.

## 2021-10-19 LAB — COMPREHENSIVE METABOLIC PANEL
ALT: 21 IU/L (ref 0–32)
AST: 22 IU/L (ref 0–40)
Albumin/Globulin Ratio: 1.9 (ref 1.2–2.2)
Albumin: 4.1 g/dL (ref 3.7–4.7)
Alkaline Phosphatase: 51 IU/L (ref 44–121)
BUN/Creatinine Ratio: 21 (ref 12–28)
BUN: 17 mg/dL (ref 8–27)
Bilirubin Total: 0.5 mg/dL (ref 0.0–1.2)
CO2: 31 mmol/L — ABNORMAL HIGH (ref 20–29)
Calcium: 9.4 mg/dL (ref 8.7–10.3)
Chloride: 99 mmol/L (ref 96–106)
Creatinine, Ser: 0.81 mg/dL (ref 0.57–1.00)
Globulin, Total: 2.2 g/dL (ref 1.5–4.5)
Glucose: 82 mg/dL (ref 70–99)
Potassium: 4.4 mmol/L (ref 3.5–5.2)
Sodium: 138 mmol/L (ref 134–144)
Total Protein: 6.3 g/dL (ref 6.0–8.5)
eGFR: 75 mL/min/{1.73_m2} (ref >59)

## 2021-10-19 LAB — DIGOXIN LEVEL: Digoxin, Serum: <0.4 ng/mL — ABNORMAL LOW (ref 0.5–0.9)

## 2021-10-19 LAB — LIPID PANEL
Chol/HDL Ratio: 2.4 ratio (ref 0.0–4.4)
Cholesterol, Total: 200 mg/dL — ABNORMAL HIGH (ref 100–199)
HDL: 83 mg/dL (ref >39)
LDL Chol Calc (NIH): 103 mg/dL — ABNORMAL HIGH (ref 0–99)
Triglycerides: 78 mg/dL (ref 0–149)
VLDL Cholesterol Cal: 14 mg/dL (ref 5–40)

## 2021-10-19 LAB — MAGNESIUM: Magnesium: 2.3 mg/dL (ref 1.6–2.3)

## 2021-10-23 NOTE — Progress Notes (Unsigned)
Cardiology Office Note:    Date:  10/26/2021   ID:  Leah Simmons, DOB 1946/02/04, MRN [id number]  PCP:  Leah Greenhouse, MD  Cardiologist:  Leah More, MD    Referring MD: Leah Greenhouse, MD    ASSESSMENT:    1. Paroxysmal atrial fibrillation (HCC)   2. High risk medication use   3. Long term current use of anticoagulant therapy   4. Hypertensive heart disease with congestive heart failure, unspecified heart failure type ([place])   5. Mixed hyperlipidemia    PLAN:    In order of problems listed above:  She continues to do well maintaining sinus rhythm on low-dose flecainide without signs of toxicity as well as low-dose digoxin for rate control with AV goal INR less than 1 she will continue warfarin and offered to transition her again to direct anticoagulant she declines Stable Home blood pressure runs [id number] to [id number] systolic continue ARB Her lipids are at goal does not require a statin at this point time We reviewed her recent labs together during the office visit   Next appointment: 9 months   Medication Adjustments/Labs and Tests Ordered: Current medicines are reviewed at length with the patient today.  Concerns regarding medicines are outlined above.  Orders Placed This Encounter  Procedures   EKG 12-Lead   No orders of the defined types were placed in this encounter.   Chief complaint follow-up for atrial fibrillation   History of Present Illness:    Leah Simmons is a 76 y.o. female with a hx of paroxysmal atrial fibrillation maintaining sinus rhythm on low-dose flecainide and anticoagulated hypertensive heart disease  and hyperlipidemia  last seen 04/27/2021.  Most recent INR 2.4 performed 09/13/2021.  Compliance with diet, lifestyle and medications: She continues to do well compliant with her medications  She has had no recurrent atrial fibrillation or bleeding from her warfarin.  She is managing her PCP office with therapeutic INR No palpitation  edema shortness of breath chest pain or syncope He has inflammatory vulvar problem researched the Internet is sent vitamin K be beneficial Please not do that as she transition to direct anticoagulant. Past Medical History:  Diagnosis Date   Allergic rhinitis 09/06/2015   Anxiety 09/06/2015   Atrophic vaginitis 02/08/2016   Benign hypertension 09/06/2015   Chronic atrial fibrillation ([place]) 07/27/2015   Overview:  2003: dx   Chronic rheumatic arthritis ([place]) 02/08/2016   Congestive heart failure (CHF) ([place]) 04/19/2016   Essential hypertension 11/07/2014   GERD (gastroesophageal reflux disease) 02/08/2016   High risk medication use 07/03/2017   Hypertensive heart disease 09/06/2015   Long term current use of anticoagulant therapy 07/27/2015   Mixed hyperlipidemia 11/07/2014   Osteoporosis 02/08/2016   Overview:  2002: -2.0 2012: -1.4 2014: -1.9 2017: -1.9 2017: fx humerus   Paroxysmal atrial fibrillation ([place]) 07/27/2015   Overview:  2003: dx   Screening for breast cancer 10/19/2016   Screening for cervical cancer 10/19/2016   Screening for colon cancer 10/19/2016   Screening for diabetes mellitus (DM) 10/19/2016   Sensation of pressure in bladder area 09/19/2017   2019   Sick sinus syndrome ([place]) 09/06/2015   Sinusitis 09/22/2015   2017, 2017, 2017   TIA (transient ischemic attack) 09/06/2015   Overview:  2003: right face sensation changes   Wellness examination 10/19/2016    Past Surgical History:  Procedure Laterality Date   LAPAROSCOPIC CHOLECYSTECTOMY     TONSILLECTOMY      Current Medications: Current  Meds  Medication Sig   ALOE VERA PO Take 800 mg by mouth daily.   B Complex Vitamins (VITAMIN B COMPLEX PO) Take 1 tablet by mouth daily.   Calcium Carb-Cholecalciferol (CALCIUM-VITAMIN D) 500-200 MG-UNIT tablet Take 1 tablet by mouth daily.   candesartan (ATACAND) 32 MG tablet TAKE 1/2 TABLET(16 MG) BY MOUTH DAILY   Cholecalciferol (VITAMIN D3) 2000 units capsule Take 2,000 Units by mouth  daily.   clobetasol ointment (TEMOVATE) 0.05 % Apply topically 2 (two) times daily.   conjugated estrogens (PREMARIN) vaginal cream Place 0.5 g vaginally 2 (two) times a week.   cycloSPORINE (RESTASIS) 0.05 % ophthalmic emulsion Place 1 drop into both eyes daily.   digoxin (LANOXIN) 0.125 MG tablet Take 1 tablet (0.125 mg total) by mouth daily. (Patient taking differently: Take 0.125 mg by mouth every other day.)   diltiazem (CARDIZEM) 30 MG tablet Take 30 mg by mouth 4 (four) times daily as needed for heart rate control.   flecainide (TAMBOCOR) 50 MG tablet TAKE 1 TABLET(50 MG) BY MOUTH TWICE DAILY   loratadine (CLARITIN) 10 MG tablet Take 10 mg by mouth daily.   Magnesium Oxide 140 MG CAPS Take 140 mg by mouth daily.   mometasone (NASONEX) 50 MCG/ACT nasal spray Place 2 sprays into the nose daily.   PARoxetine (PAXIL) 20 MG tablet Take 20 mg by mouth daily.   potassium chloride SA (KLOR-CON) 20 MEQ tablet TAKE 1 TABLET(20 MEQ) BY MOUTH TWICE DAILY   traZODone (DESYREL) 50 MG tablet Take 50 mg by mouth at bedtime.   warfarin (JANTOVEN) 3 MG tablet Take 3 mg by mouth 3 (three) times a week. Then takes 1.5 mg after 3 days of 3 mg     Allergies:   Clindamycin, Amitriptyline, Epinephrine, Levofloxacin, Moxifloxacin, Penicillins, Pravastatin, Sulfa antibiotics, Clarithromycin, and Other   Social History   Socioeconomic History   Marital status: Widowed    Spouse name: Not on file   Number of children: Not on file   Years of education: Not on file   Highest education level: Not on file  Occupational History   Not on file  Tobacco Use   Smoking status: Never   Smokeless tobacco: Never  Vaping Use   Vaping Use: Never used  Substance and Sexual Activity   Alcohol use: No   Drug use: No   Sexual activity: Not on file  Other Topics Concern   Not on file  Social History Narrative   Not on file   Social Determinants of Health   Financial Resource Strain: Not on file  Food Insecurity:  Not on file  Transportation Needs: Not on file  Physical Activity: Not on file  Stress: Not on file  Social Connections: Not on file     Family History: The patient's family history includes Osteoarthritis in her mother; Rheum arthritis in her mother; Stroke in her mother. ROS:   Please see the history of present illness.    All other systems reviewed and are negative.  EKGs/Labs/Other Studies Reviewed:    The following studies were reviewed today:  EKG:  EKG ordered today and personally reviewed.  The ekg ordered today demonstrates sinus rhythm left atrial abnormality incomplete right bundle branch block no sign of 1C antiarrhythmic drug toxicity  Recent Labs: 10/18/2021: ALT 21; BUN 17; Creatinine, Ser 0.81; Magnesium 2.3; Potassium 4.4; Sodium 138  Recent Lipid Panel    Component Value Date/Time   CHOL 200 (H) 10/18/2021 0817   TRIG 78 10/18/2021  0817   HDL 83 10/18/2021 0817   CHOLHDL 2.4 10/18/2021 0817   LDLCALC 103 (H) 10/18/2021 0817    Physical Exam:    VS:  BP 110/60 (BP Location: Right Arm, Patient Position: Sitting)   Pulse (!) 57   Ht 5\' 1"  (1.549 m)   Wt 106 lb 12.8 oz (48.4 kg)   SpO2 98%   BMI 20.18 kg/m     Wt Readings from Last 3 Encounters:  10/26/21 106 lb 12.8 oz (48.4 kg)  04/27/21 108 lb 3.2 oz (49.1 kg)  10/14/20 108 lb (49 kg)     GEN:  Well nourished, well developed in no acute distress HEENT: Normal NECK: No JVD; No carotid bruits LYMPHATICS: No lymphadenopathy CARDIAC: RRR, no murmurs, rubs, gallops RESPIRATORY:  Clear to auscultation without rales, wheezing or rhonchi  ABDOMEN: Soft, non-tender, non-distended MUSCULOSKELETAL:  No edema; No deformity  SKIN: Warm and dry NEUROLOGIC:  Alert and oriented x 3 PSYCHIATRIC:  Normal affect    Signed, 12/14/20, MD  10/26/2021 8:38 AM     [facility]

## 2021-10-26 ENCOUNTER — Encounter: Payer: Self-pay | Admitting: Cardiology

## 2021-10-26 ENCOUNTER — Ambulatory Visit (INDEPENDENT_AMBULATORY_CARE_PROVIDER_SITE_OTHER): Payer: PPO | Admitting: Cardiology

## 2021-10-26 VITALS — BP 110/60 | HR 57 | Ht 61.0 in | Wt 106.8 lb

## 2021-10-26 DIAGNOSIS — I480 Paroxysmal atrial fibrillation: Secondary | ICD-10-CM | POA: Diagnosis not present

## 2021-10-26 DIAGNOSIS — Z79899 Other long term (current) drug therapy: Secondary | ICD-10-CM | POA: Diagnosis not present

## 2021-10-26 DIAGNOSIS — I110 Hypertensive heart disease with heart failure: Secondary | ICD-10-CM | POA: Diagnosis not present

## 2021-10-26 DIAGNOSIS — Z7901 Long term (current) use of anticoagulants: Secondary | ICD-10-CM

## 2021-10-26 DIAGNOSIS — E782 Mixed hyperlipidemia: Secondary | ICD-10-CM

## 2021-10-26 NOTE — Patient Instructions (Signed)

## 2021-11-06 ENCOUNTER — Other Ambulatory Visit: Payer: Self-pay | Admitting: Cardiology

## 2021-12-21 ENCOUNTER — Other Ambulatory Visit: Payer: Self-pay | Admitting: Family Medicine

## 2021-12-21 DIAGNOSIS — Z1231 Encounter for screening mammogram for malignant neoplasm of breast: Secondary | ICD-10-CM

## 2022-01-04 ENCOUNTER — Other Ambulatory Visit: Payer: Self-pay | Admitting: Cardiology

## 2022-01-31 ENCOUNTER — Ambulatory Visit: Payer: PPO

## 2022-02-08 ENCOUNTER — Other Ambulatory Visit: Payer: Self-pay | Admitting: Cardiology

## 2022-02-09 NOTE — Telephone Encounter (Signed)
Refill to pharmacy 

## 2022-02-18 ENCOUNTER — Ambulatory Visit
Admission: RE | Admit: 2022-02-18 | Discharge: 2022-02-18 | Disposition: A | Discharge status: Home / Self Care | Payer: PPO | Source: Ambulatory Visit | Attending: Family Medicine | Admitting: Family Medicine

## 2022-02-18 DIAGNOSIS — Z1231 Encounter for screening mammogram for malignant neoplasm of breast: Secondary | ICD-10-CM

## 2022-03-14 ENCOUNTER — Other Ambulatory Visit: Payer: Self-pay | Admitting: Cardiology

## 2022-03-14 NOTE — Telephone Encounter (Signed)
Rx refill sent to pharmacy. 

## 2022-05-16 DIAGNOSIS — U071 COVID-19: Secondary | ICD-10-CM | POA: Insufficient documentation

## 2022-05-16 HISTORY — DX: COVID-19: U07.1

## 2022-06-27 ENCOUNTER — Telehealth: Payer: Self-pay | Admitting: Cardiology

## 2022-06-27 NOTE — Telephone Encounter (Signed)
Called patient and she reported that she would like to switch from Coumadin to Eliquis because she had Covid recently and she developed a rash all over her body and she went to wellness doctor and he would like to start her on Ivermectin for the rash but he told her that she needs to be off of Coumadin to start Ivermectin. She states that the wellness doctor told her that the Ivermectin was not FDA approved.Please advise.

## 2022-06-27 NOTE — Telephone Encounter (Signed)
Pt c/o medication issue:  1. Name of Medication:   warfarin (JANTOVEN) 3 MG tablet    2. How are you currently taking this medication (dosage and times per day)?   Take 3 mg by mouth 3 (three) times a week. Then takes 1.5 mg after 3 days of 3 mg    3. Are you having a reaction (difficulty breathing--STAT)? No  4. What is your medication issue? Pt would like to know if she is able to be taken off medication and be prescribed Eliquis due to new diagnosis. Please advise

## 2022-06-28 NOTE — Telephone Encounter (Signed)
Left message for the patient to call back.

## 2022-06-28 NOTE — Telephone Encounter (Signed)
Called patient and informed her of Dr. Wendy Poet recommendation below:  "D advises not to take not FDA approved medication for indications that is not approved so, advises not to take Ivermectin"  Patient had no further questions at this time.

## 2022-07-15 ENCOUNTER — Other Ambulatory Visit: Payer: Self-pay

## 2022-07-15 ENCOUNTER — Telehealth: Payer: Self-pay

## 2022-07-15 DIAGNOSIS — E782 Mixed hyperlipidemia: Secondary | ICD-10-CM

## 2022-07-15 DIAGNOSIS — Z79899 Other long term (current) drug therapy: Secondary | ICD-10-CM

## 2022-07-15 NOTE — Telephone Encounter (Signed)
Called the patient and informed her of Dr. Joya Gaskins recommendations for her lab work below:  "Laboratory test prior to her visit should include digoxin level flecainide level CMP lipid profile"  Patient was agreeable with this plan and stated that she would be here at 8 am on Monday to have her labs drawn. Patient had no further questions at this time.

## 2022-07-28 LAB — COMPREHENSIVE METABOLIC PANEL
ALT: 14 IU/L (ref 0–32)
AST: 21 IU/L (ref 0–40)
Albumin/Globulin Ratio: 1.8 (ref 1.2–2.2)
Albumin: 4.2 g/dL (ref 3.8–4.8)
Alkaline Phosphatase: 58 IU/L (ref 44–121)
BUN/Creatinine Ratio: 21 (ref 12–28)
BUN: 17 mg/dL (ref 8–27)
Bilirubin Total: 0.3 mg/dL (ref 0.0–1.2)
CO2: 24 mmol/L (ref 20–29)
Calcium: 9.5 mg/dL (ref 8.7–10.3)
Chloride: 102 mmol/L (ref 96–106)
Creatinine, Ser: 0.81 mg/dL (ref 0.57–1.00)
Globulin, Total: 2.3 g/dL (ref 1.5–4.5)
Glucose: 87 mg/dL (ref 70–99)
Potassium: 4.7 mmol/L (ref 3.5–5.2)
Sodium: 142 mmol/L (ref 134–144)
Total Protein: 6.5 g/dL (ref 6.0–8.5)
eGFR: 75 mL/min/{1.73_m2} (ref >59)

## 2022-07-28 LAB — LIPID PANEL
Chol/HDL Ratio: 2.5 ratio (ref 0.0–4.4)
Cholesterol, Total: 206 mg/dL — ABNORMAL HIGH (ref 100–199)
HDL: 83 mg/dL (ref >39)
LDL Chol Calc (NIH): 110 mg/dL — ABNORMAL HIGH (ref 0–99)
Triglycerides: 75 mg/dL (ref 0–149)
VLDL Cholesterol Cal: 13 mg/dL (ref 5–40)

## 2022-07-28 LAB — DIGOXIN LEVEL: Digoxin, Serum: 0.5 ng/mL (ref 0.5–0.9)

## 2022-08-05 LAB — FLECAINIDE LEVEL: Flecainide: 0.21 ug/ml (ref 0.20–1.00)

## 2022-08-07 NOTE — Progress Notes (Unsigned)
Cardiology Office Note:    Date:  08/08/2022   ID:  CARIL LINGE, DOB [birth year], MRN [id number]  PCP:  Algis Greenhouse, MD  Cardiologist:  Shirlee More, MD    Referring MD: Algis Greenhouse, MD    ASSESSMENT:    1. Paroxysmal atrial fibrillation   2. High risk medication use   3. Long term current use of anticoagulant therapy   4. Hypertensive heart disease with congestive heart failure, unspecified heart failure type   5. Mixed hyperlipidemia    PLAN:    In order of problems listed above:  Cardiology perspective doing well maintaining sinus rhythm with low-dose flecainide and her current anticoagulant.  There is no indication these drugs are the cause of the skin rash that she saw dermatology for that is resolved Stable hypertension currently not on any antihypertensive agents Lipid profile reviewed I would not start lipid-lowering treatment ANA CBC sed rate C-reactive protein at her request if abnormal I will refer her to rheumatology   Next appointment: 6 months   Medication Adjustments/Labs and Tests Ordered: Current medicines are reviewed at length with the patient today.  Concerns regarding medicines are outlined above.  No orders of the defined types were placed in this encounter.  No orders of the defined types were placed in this encounter.   Chief Complaint  Patient presents with   Follow-up   Atrial Fibrillation    History of Present Illness:    Leah Simmons is a 77 y.o. female with a hx of paroxysmal atrial fibrillation maintaining sinus rhythm on low-dose flecainide and digoxin with warfarin anticoagulation hypertensive heart disease and hyperlipidemia last seen 10/29/2021.  Compliance with diet, lifestyle and medications: Yes  She had COVID again after her first visit about a month later developed a rash it seems to have by a picture she showed me to look more urticarial but told me she had a biopsy was told it was purpura and pigmented  dermatosis.  Subsequently was seen again by dermatology was told it was unrelated to [place].  She has a friend who looked at the pictures and told her she is concerned she may have vasculitis.  She is a high potency steroid cream the rash resolved that occurred after taking antiviral therapy for COVID and she has no systemic symptoms.  She request me to check inflammatory markers CRP sedimentation rate CBC and an ANA. By cardiology perspective doing well maintaining sinus rhythm no bleeding with her anticoagulant and no cardiovascular symptoms of edema shortness of breath chest pain palpitation or syncope I reviewed her labs with her Past Medical History:  Diagnosis Date   Allergic rhinitis 09/06/2015   Anxiety 09/06/2015   Atrophic vaginitis 02/08/2016   Benign hypertension 09/06/2015   Chronic atrial fibrillation 07/27/2015   Overview:  2003: dx   Chronic rheumatic arthritis 02/08/2016   Congestive heart failure (CHF) 04/19/2016   Essential hypertension 11/07/2014   GERD (gastroesophageal reflux disease) 02/08/2016   High risk medication use 07/03/2017   Hypertensive heart disease 09/06/2015   Long term current use of anticoagulant therapy 07/27/2015   Mixed hyperlipidemia 11/07/2014   Osteoporosis 02/08/2016   Overview:  2002: -2.0 2012: -1.4 2014: -1.9 2017: -1.9 2017: fx humerus   Paroxysmal atrial fibrillation 07/27/2015   Overview:  2003: dx   Screening for breast cancer 10/19/2016   Screening for cervical cancer 10/19/2016   Screening for colon cancer 10/19/2016   Screening for diabetes mellitus (DM) 10/19/2016   Sensation  of pressure in bladder area 09/19/2017   2019   Sick sinus syndrome 09/06/2015   Sinusitis 09/22/2015   2017, 2017, 2017   TIA (transient ischemic attack) 09/06/2015   Overview:  2003: right face sensation changes   Wellness examination 10/19/2016    Past Surgical History:  Procedure Laterality Date   LAPAROSCOPIC CHOLECYSTECTOMY     TONSILLECTOMY      Current  Medications: Current Meds  Medication Sig   ALOE VERA PO Take 800 mg by mouth daily.   B Complex Vitamins (VITAMIN B COMPLEX PO) Take 1 tablet by mouth daily.   Calcium Carb-Cholecalciferol (CALCIUM-VITAMIN D) 500-200 MG-UNIT tablet Take 1 tablet by mouth daily.   candesartan (ATACAND) 32 MG tablet Take 0.5 tablets (16 mg total) by mouth daily.   Cholecalciferol (VITAMIN D3) 2000 units capsule Take 2,000 Units by mouth daily.   clobetasol ointment (TEMOVATE) 0.05 % Apply topically 2 (two) times daily.   conjugated estrogens (PREMARIN) vaginal cream Place 0.5 g vaginally 2 (two) times a week.   cycloSPORINE (RESTASIS) 0.05 % ophthalmic emulsion Place 1 drop into both eyes daily.   digoxin (LANOXIN) 0.125 MG tablet TAKE 1 TABLET(0.125 MG) BY MOUTH EVERY OTHER DAILY   diltiazem (CARDIZEM) 30 MG tablet Take 30 mg by mouth 4 (four) times daily as needed for heart rate control.   ELIQUIS 5 MG TABS tablet Take 5 mg by mouth 2 (two) times daily.   flecainide (TAMBOCOR) 50 MG tablet TAKE 1 TABLET(50 MG) BY MOUTH TWICE DAILY   loratadine (CLARITIN) 10 MG tablet Take 10 mg by mouth daily.   Magnesium Oxide 140 MG CAPS Take 140 mg by mouth daily.   mometasone (NASONEX) 50 MCG/ACT nasal spray Place 2 sprays into the nose daily.   PARoxetine (PAXIL) 20 MG tablet Take 20 mg by mouth daily.   potassium chloride SA (KLOR-CON M) 20 MEQ tablet TAKE 1 TABLET(20 MEQ) BY MOUTH TWICE DAILY   traZODone (DESYREL) 50 MG tablet Take 50 mg by mouth at bedtime.     Allergies:   Clindamycin, Amitriptyline, Epinephrine, Levofloxacin, Moxifloxacin, Nitrofurantoin, Penicillins, Pravastatin, Sulfa antibiotics, Clarithromycin, and Other   Social History   Socioeconomic History   Marital status: Widowed    Spouse name: Not on file   Number of children: Not on file   Years of education: Not on file   Highest education level: Not on file  Occupational History   Not on file  Tobacco Use   Smoking status: Never    Smokeless tobacco: Never  Vaping Use   Vaping Use: Never used  Substance and Sexual Activity   Alcohol use: No   Drug use: No   Sexual activity: Not on file  Other Topics Concern   Not on file  Social History Narrative   Not on file   Social Determinants of Health   Financial Resource Strain: Not on file  Food Insecurity: Not on file  Transportation Needs: Not on file  Physical Activity: Not on file  Stress: Not on file  Social Connections: Not on file     Family History: The patient's family history includes Osteoarthritis in her mother; Rheum arthritis in her mother; Stroke in her mother. ROS:   Please see the history of present illness.    All other systems reviewed and are negative.  EKGs/Labs/Other Studies Reviewed:    The following studies were reviewed today:  Cardiac Studies & Procedures       ECHOCARDIOGRAM  ECHOCARDIOGRAM COMPLETE 07/05/2017  EKG:  EKG ordered today and personally reviewed.  The ekg ordered today demonstrates sinus rhythm right bundle branch block incomplete left atrial abnormality  Recent Labs: 10/18/2021: Magnesium 2.3 07/27/2022: ALT 14; BUN 17; Creatinine, Ser 0.81; Potassium 4.7; Sodium 142  Recent Lipid Panel    Component Value Date/Time   CHOL 206 (H) 07/27/2022 0812   TRIG 75 07/27/2022 0812   HDL 83 07/27/2022 0812   CHOLHDL 2.5 07/27/2022 0812   LDLCALC 110 (H) 07/27/2022 0812    Physical Exam:    VS:  BP (!) 96/52 (BP Location: Right Arm, Patient Position: Sitting)   Pulse 66   Ht 5\' 1"  (1.549 m)   Wt 106 lb (48.1 kg)   SpO2 98%   BMI 20.03 kg/m     Wt Readings from Last 3 Encounters:  08/08/22 106 lb (48.1 kg)  10/26/21 106 lb 12.8 oz (48.4 kg)  04/27/21 108 lb 3.2 oz (49.1 kg)     GEN:  Well nourished, well developed in no acute distress HEENT: Normal NECK: No JVD; No carotid bruits LYMPHATICS: No lymphadenopathy CARDIAC: RRR, no murmurs, rubs, gallops RESPIRATORY:  Clear to auscultation  without rales, wheezing or rhonchi  ABDOMEN: Soft, non-tender, non-distended MUSCULOSKELETAL:  No edema; No deformity  SKIN: Warm and dry NEUROLOGIC:  Alert and oriented x 3 PSYCHIATRIC:  Normal affect    Signed, Shirlee More, MD  08/08/2022 9:19 AM    [place]

## 2022-08-08 ENCOUNTER — Encounter: Payer: Self-pay | Admitting: Cardiology

## 2022-08-08 ENCOUNTER — Ambulatory Visit: Payer: PPO | Attending: Cardiology | Admitting: Cardiology

## 2022-08-08 VITALS — BP 96/52 | HR 66 | Ht 61.0 in | Wt 106.0 lb

## 2022-08-08 DIAGNOSIS — I110 Hypertensive heart disease with heart failure: Secondary | ICD-10-CM

## 2022-08-08 DIAGNOSIS — Z79899 Other long term (current) drug therapy: Secondary | ICD-10-CM

## 2022-08-08 DIAGNOSIS — E782 Mixed hyperlipidemia: Secondary | ICD-10-CM

## 2022-08-08 DIAGNOSIS — Z7901 Long term (current) use of anticoagulants: Secondary | ICD-10-CM

## 2022-08-08 DIAGNOSIS — I480 Paroxysmal atrial fibrillation: Secondary | ICD-10-CM

## 2022-08-08 NOTE — Patient Instructions (Signed)
Medication Instructions:  Your physician recommends that you continue on your current medications as directed. Please refer to the Current Medication list given to you today.  *If you need a refill on your cardiac medications before your next appointment, please call your pharmacy*   Lab Work: Your physician recommends that you return for lab work in:   Labs today: CBC, Sed Rate, CRP, ANA  If you have labs (blood work) drawn today and your tests are completely normal, you will receive your results only by: MyChart Message (if you have MyChart) OR A paper copy in the mail If you have any lab test that is abnormal or we need to change your treatment, we will call you to review the results.   Testing/Procedures: None   Follow-Up: At [facility], you and your health needs are our priority.  As part of our continuing mission to provide you with exceptional heart care, we have created designated Provider Care Teams.  These Care Teams include your primary Cardiologist (physician) and Advanced Practice Providers (APPs -  Physician Assistants and Nurse Practitioners) who all work together to provide you with the care you need, when you need it.  We recommend signing up for the patient portal called "MyChart".  Sign up information is provided on this After Visit Summary.  MyChart is used to connect with patients for Virtual Visits (Telemedicine).  Patients are able to view lab/test results, encounter notes, upcoming appointments, etc.  Non-urgent messages can be sent to your provider as well.   To learn more about what you can do with MyChart, go to [email or web address].    Your next appointment:   6 month(s)  Provider:   Shirlee More, MD    Other Instructions None

## 2022-08-09 ENCOUNTER — Telehealth: Payer: Self-pay

## 2022-08-09 ENCOUNTER — Other Ambulatory Visit: Payer: Self-pay

## 2022-08-09 DIAGNOSIS — M069 Rheumatoid arthritis, unspecified: Secondary | ICD-10-CM

## 2022-08-09 LAB — CBC
Hematocrit: 45.1 % (ref 34.0–46.6)
Hemoglobin: 14.6 g/dL (ref 11.1–15.9)
MCH: 30.2 pg (ref 26.6–33.0)
MCHC: 32.4 g/dL (ref 31.5–35.7)
MCV: 93 fL (ref 79–97)
Platelets: 227 10*3/uL (ref 150–450)
RBC: 4.83 x10E6/uL (ref 3.77–5.28)
RDW: 13.4 % (ref 11.7–15.4)
WBC: 5.1 10*3/uL (ref 3.4–10.8)

## 2022-08-09 LAB — C-REACTIVE PROTEIN: CRP: <1 mg/L (ref 0–10)

## 2022-08-09 LAB — ANA: Anti Nuclear Antibody (ANA): NEGATIVE

## 2022-08-09 LAB — SEDIMENTATION RATE: Sed Rate: 3 mm/hr (ref 0–40)

## 2022-08-09 NOTE — Progress Notes (Unsigned)
Called pat 

## 2022-08-09 NOTE — Telephone Encounter (Signed)
Called patient to inform her of her lab results from her CRP, Sed rate and ANA tests. After informing her of her lab results she asked for Dr. Bettina Gavia to order an ANCA lab test for her. Spoke to Dr. Bettina Gavia to see if I could order an ANCA lab test for this patient and he recommended that she be referred to rheumatology and have her speak to the rheumatologist regarding these lab test. Informed the patient of Dr. Joya Gaskins recommendation and she was agreeable with that plan. A referral to rheumatology was placed in Epic and the patient had no further questions at this time.

## 2022-08-16 ENCOUNTER — Other Ambulatory Visit: Payer: Self-pay

## 2022-08-16 MED ORDER — DIGOXIN 125 MCG PO TABS: ORAL_TABLET | ORAL | 2 refills | Status: DC

## 2022-08-16 NOTE — Telephone Encounter (Signed)
Rx to pharmacy

## 2022-10-17 DIAGNOSIS — R58 Hemorrhage, not elsewhere classified: Secondary | ICD-10-CM | POA: Insufficient documentation

## 2022-10-17 HISTORY — DX: Hemorrhage, not elsewhere classified: R58

## 2022-10-24 ENCOUNTER — Other Ambulatory Visit: Payer: Self-pay | Admitting: Cardiology

## 2022-10-26 ENCOUNTER — Other Ambulatory Visit: Payer: Self-pay | Admitting: Cardiology

## 2023-01-05 ENCOUNTER — Other Ambulatory Visit: Payer: Self-pay | Admitting: Cardiology

## 2023-01-24 ENCOUNTER — Other Ambulatory Visit: Payer: Self-pay

## 2023-01-24 DIAGNOSIS — E782 Mixed hyperlipidemia: Secondary | ICD-10-CM

## 2023-01-24 DIAGNOSIS — R7889 Finding of other specified substances, not normally found in blood: Secondary | ICD-10-CM

## 2023-01-24 DIAGNOSIS — Z79899 Other long term (current) drug therapy: Secondary | ICD-10-CM

## 2023-01-24 DIAGNOSIS — I480 Paroxysmal atrial fibrillation: Secondary | ICD-10-CM

## 2023-01-30 ENCOUNTER — Other Ambulatory Visit: Payer: Self-pay

## 2023-01-30 NOTE — Progress Notes (Unsigned)
Cardiology Office Note:    Date:  01/31/2023   ID:  Leah Simmons, DOB 1945-09-13, MRN [id number]  PCP:  Olive Bass, MD  Cardiologist:  Norman Herrlich, MD    Referring MD: Olive Bass, MD    ASSESSMENT:    1. Paroxysmal atrial fibrillation (HCC)   2. High risk medication use   3. Long term current use of anticoagulant therapy   4. Serum digoxin level within therapeutic range   5. Hypertensive heart disease with chronic diastolic congestive heart failure (HCC)   6. Mixed hyperlipidemia    PLAN:    In order of problems listed above:  Her cardiology perspective doing well I think her dose of digoxin is reasonable for her body mass and age and will correct her chart and give her a reduced tablet next prescription Continue low-dose flecainide minimum dose of digoxin for rate control and her current anticoagulant If anything low blood pressure symptomatic stop her ARB and she will continue to trend her blood pressures at home if systolics are consistently greater than 130 we may need to restart With all of her complaints persistent Epstein-Barr in the [place] has not to consider starting and at this time and said we review at next visit   Next appointment: 6 months   Medication Adjustments/Labs and Tests Ordered: Current medicines are reviewed at length with the patient today.  Concerns regarding medicines are outlined above.  Orders Placed This Encounter  Procedures   EKG 12-Lead   No orders of the defined types were placed in this encounter.    History of Present Illness:    Leah Simmons is a 77 y.o. female with a hx of paroxysmal atrial fibrillation maintaining sinus rhythm on low-dose flecainide chronic anticoagulation hypertensive heart disease with congestive heart failure and hyper lipidemia last seen 08/08/2022.  Compliance with diet, lifestyle and medications: Yes  She has had persistent neuropathic pain since she had COVID she is seeing an  alternative practitioner is taking supplements and is improved She has been holding her Atacand with systolic blood pressures of 90-100 and lightheadedness.  At On her own she reduced her digoxin to 0.0 65 mg every other day From a cardiology perspective doing well not having edema shortness of breath orthopnea angina palpitation or syncope She has had no bleeding complication from her anticoagulant Past Medical History:  Diagnosis Date   Allergic rhinitis 09/06/2015   Anxiety 09/06/2015   Atrophic vaginitis 02/08/2016   Benign hypertension 09/06/2015   Carbuncle 09/13/2021   09/13/2021 : left perineal     Chronic atrial fibrillation (HCC) 07/27/2015   Overview:  2003: dx   Chronic rheumatic arthritis (HCC) 02/08/2016   Congestive heart failure (CHF) (HCC) 04/19/2016   Contusion of nose 07/19/2021   07/19/2021     Contusion, chest wall 07/19/2021   07/19/2021     COVID-19 virus infection 05/16/2022   05/16/2022 : monupiravir     Degeneration of lumbar intervertebral disc 07/18/2013   Ecchymosis 10/17/2022   10/17/2022 : left calf     Essential hypertension 11/07/2014   GERD (gastroesophageal reflux disease) 02/08/2016   High risk medication use 07/03/2017   Hypertensive heart disease 09/06/2015   Idiopathic scoliosis and kyphoscoliosis 07/18/2013   Intertrigo 09/01/2021   09/01/2021     Long term current use of anticoagulant therapy 07/27/2015   Low back pain 07/18/2013   Lumbar disc disease 08/17/2021   08/17/2021: XR multi-level with OA and spurring  Lumbosacral radiculitis 07/18/2013   Lumbosacral spondylosis without myelopathy 07/18/2013   Mixed hyperlipidemia 11/07/2014   Osteoporosis 02/08/2016   Overview:  2002: -2.0 2012: -1.4 2014: -1.9 2017: -1.9 2017: fx humerus   Paresthesia 08/17/2021   08/17/2021: (B) L1     Paroxysmal atrial fibrillation (HCC) 07/27/2015   Overview:  2003: dx   Rectal pain 09/21/2021   Scoliosis of thoracolumbar spine 08/17/2021   Screening for  breast cancer 10/19/2016   Screening for cervical cancer 10/19/2016   Screening for colon cancer 10/19/2016   Screening for diabetes mellitus (DM) 10/19/2016   Sensation of pressure in bladder area 09/19/2017   2019   Sick sinus syndrome (HCC) 09/06/2015   Sinusitis 09/22/2015   2017, 2017, 2017   Spinal stenosis of lumbar region 07/18/2013   TIA (transient ischemic attack) 09/06/2015   Overview:  2003: right face sensation changes   UTI symptoms 08/17/2021   Viral respiratory infection 11/02/2020   05/16/2022     Wellness examination 10/19/2016    Current Medications: Current Meds  Medication Sig   ALOE VERA PO Take 800 mg by mouth daily.   B Complex Vitamins (VITAMIN B COMPLEX PO) Take 1 tablet by mouth daily.   Calcium Carb-Cholecalciferol (CALCIUM-VITAMIN D) 500-200 MG-UNIT tablet Take 1 tablet by mouth daily.   candesartan (ATACAND) 32 MG tablet TAKE 1/2 TABLET(16 MG) BY MOUTH DAILY   Cholecalciferol (VITAMIN D3) 2000 units capsule Take 2,000 Units by mouth daily.   conjugated estrogens (PREMARIN) vaginal cream Place 0.5 g vaginally 2 (two) times a week.   cycloSPORINE (RESTASIS) 0.05 % ophthalmic emulsion Place 1 drop into both eyes daily.   digoxin (LANOXIN) 0.125 MG tablet TAKE 1 TABLET(0.125 MG) BY MOUTH EVERY OTHER DAILY   diltiazem (CARDIZEM) 30 MG tablet Take 30 mg by mouth 4 (four) times daily as needed for heart rate control.   ELIQUIS 5 MG TABS tablet Take 5 mg by mouth 2 (two) times daily.   flecainide (TAMBOCOR) 50 MG tablet Take 1 tablet (50 mg total) by mouth 2 (two) times daily.   loratadine (CLARITIN) 10 MG tablet Take 10 mg by mouth daily.   Magnesium Oxide 140 MG CAPS Take 140 mg by mouth daily.   mometasone (NASONEX) 50 MCG/ACT nasal spray Place 2 sprays into the nose daily.   PARoxetine (PAXIL) 20 MG tablet Take 20 mg by mouth daily.   potassium chloride SA (KLOR-CON M) 20 MEQ tablet TAKE 1 TABLET(20 MEQ) BY MOUTH TWICE DAILY   traZODone (DESYREL) 50 MG  tablet Take 50 mg by mouth at bedtime.      EKGs/Labs/Other Studies Reviewed:    The following studies were reviewed today:  EKG Interpretation Date/Time:  Tuesday January 31 2023 09:55:56 EDT Ventricular Rate:  60 PR Interval:  160 QRS Duration:  90 QT Interval:  402 QTC Calculation: 402 R Axis:   97  Text Interpretation: Normal sinus rhythm Rightward axis ECG OTHERWISE WITHIN NORMAL LIMITS No previous ECGs available Confirmed by Norman Herrlich ([id number]) on 01/31/2023 10:09:24 AM   Recent Labs: 08/08/2022: Hemoglobin 14.6; Platelets 227 01/24/2023: ALT 17; BUN 17; Creatinine, Ser 0.76; Magnesium 2.0; Potassium 4.2; Sodium 139  Recent Lipid Panel    Component Value Date/Time   CHOL 216 (H) 01/24/2023 0915   TRIG 75 01/24/2023 0915   HDL 78 01/24/2023 0915   CHOLHDL 2.8 01/24/2023 0915   LDLCALC 125 (H) 01/24/2023 0915    Physical Exam:    VS:  BP 108/60  Pulse 60   Ht 5\' 1"  (1.549 m)   Wt 106 lb (48.1 kg)   SpO2 95%   BMI 20.03 kg/m     Wt Readings from Last 3 Encounters:  01/31/23 106 lb (48.1 kg)  08/08/22 106 lb (48.1 kg)  10/26/21 106 lb 12.8 oz (48.4 kg)     GEN:  Well nourished, well developed in no acute distress HEENT: Normal NECK: No JVD; No carotid bruits LYMPHATICS: No lymphadenopathy CARDIAC: RRR, no murmurs, rubs, gallops RESPIRATORY:  Clear to auscultation without rales, wheezing or rhonchi  ABDOMEN: Soft, non-tender, non-distended MUSCULOSKELETAL:  No edema; No deformity  SKIN: Warm and dry NEUROLOGIC:  Alert and oriented x 3 PSYCHIATRIC:  Normal affect    Signed, Norman Herrlich, MD  01/31/2023 10:25 AM    [facility]

## 2023-01-31 ENCOUNTER — Ambulatory Visit: Payer: PPO | Attending: Cardiology | Admitting: Cardiology

## 2023-01-31 ENCOUNTER — Encounter: Payer: Self-pay | Admitting: Cardiology

## 2023-01-31 VITALS — BP 108/60 | HR 60 | Ht 61.0 in | Wt 106.0 lb

## 2023-01-31 DIAGNOSIS — Z7901 Long term (current) use of anticoagulants: Secondary | ICD-10-CM | POA: Diagnosis not present

## 2023-01-31 DIAGNOSIS — R7889 Finding of other specified substances, not normally found in blood: Secondary | ICD-10-CM

## 2023-01-31 DIAGNOSIS — I5032 Chronic diastolic (congestive) heart failure: Secondary | ICD-10-CM

## 2023-01-31 DIAGNOSIS — I480 Paroxysmal atrial fibrillation: Secondary | ICD-10-CM

## 2023-01-31 DIAGNOSIS — Z79899 Other long term (current) drug therapy: Secondary | ICD-10-CM

## 2023-01-31 DIAGNOSIS — E782 Mixed hyperlipidemia: Secondary | ICD-10-CM

## 2023-01-31 DIAGNOSIS — I110 Hypertensive heart disease with heart failure: Secondary | ICD-10-CM

## 2023-01-31 MED ORDER — DIGOXIN 125 MCG PO TABS: 0.0625 mg | ORAL_TABLET | ORAL | 3 refills | Status: DC

## 2023-01-31 NOTE — Addendum Note (Signed)
Addended by: Roxanne Mins I on: 01/31/2023 10:38 AM   Modules accepted: Orders

## 2023-01-31 NOTE — Patient Instructions (Signed)
Medication Instructions:  Your physician has recommended you make the following change in your medication:   STOP: Atacand START: Lanoxin 0.0625 mg every other day  *If you need a refill on your cardiac medications before your next appointment, please call your pharmacy*   Lab Work: None If you have labs (blood work) drawn today and your tests are completely normal, you will receive your results only by: MyChart Message (if you have MyChart) OR A paper copy in the mail If you have any lab test that is abnormal or we need to change your treatment, we will call you to review the results.   Testing/Procedures: None   Follow-Up: At [facility], you and your health needs are our priority.  As part of our continuing mission to provide you with exceptional heart care, we have created designated Provider Care Teams.  These Care Teams include your primary Cardiologist (physician) and Advanced Practice Providers (APPs -  Physician Assistants and Nurse Practitioners) who all work together to provide you with the care you need, when you need it.  We recommend signing up for the patient portal called "MyChart".  Sign up information is provided on this After Visit Summary.  MyChart is used to connect with patients for Virtual Visits (Telemedicine).  Patients are able to view lab/test results, encounter notes, upcoming appointments, etc.  Non-urgent messages can be sent to your provider as well.   To learn more about what you can do with MyChart, go to [email or web address].    Your next appointment:   6 month(s)  Provider:   Norman Herrlich, MD    Other Instructions None

## 2023-02-03 ENCOUNTER — Ambulatory Visit: Payer: PPO | Admitting: Cardiology

## 2023-06-07 IMAGING — MG MM DIGITAL SCREENING BILAT W/ TOMO AND CAD
8 series · 9 of 24 positions shown · non-contrast
Comparison: Previous exam(s).

CLINICAL DATA: Screening.

EXAM:
DIGITAL SCREENING BILATERAL MAMMOGRAM WITH TOMOSYNTHESIS AND CAD
TECHNIQUE: Bilateral screening digital craniocaudal and mediolateral oblique
mammograms were obtained. Bilateral screening digital breast
tomosynthesis was performed. The images were evaluated with
computer-aided detection.

[R MLO synth-2D]
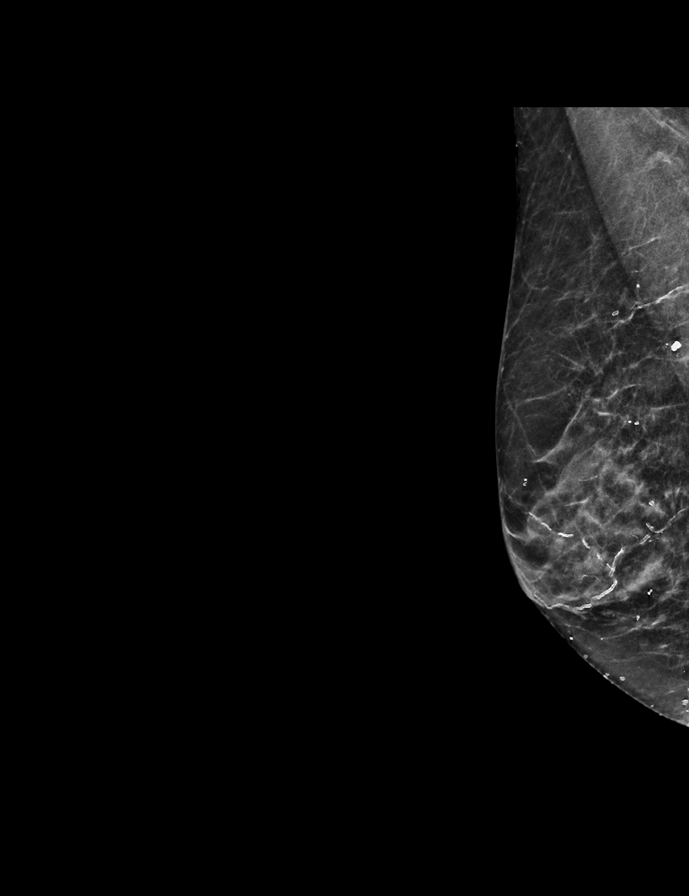

[R CC synth-2D]
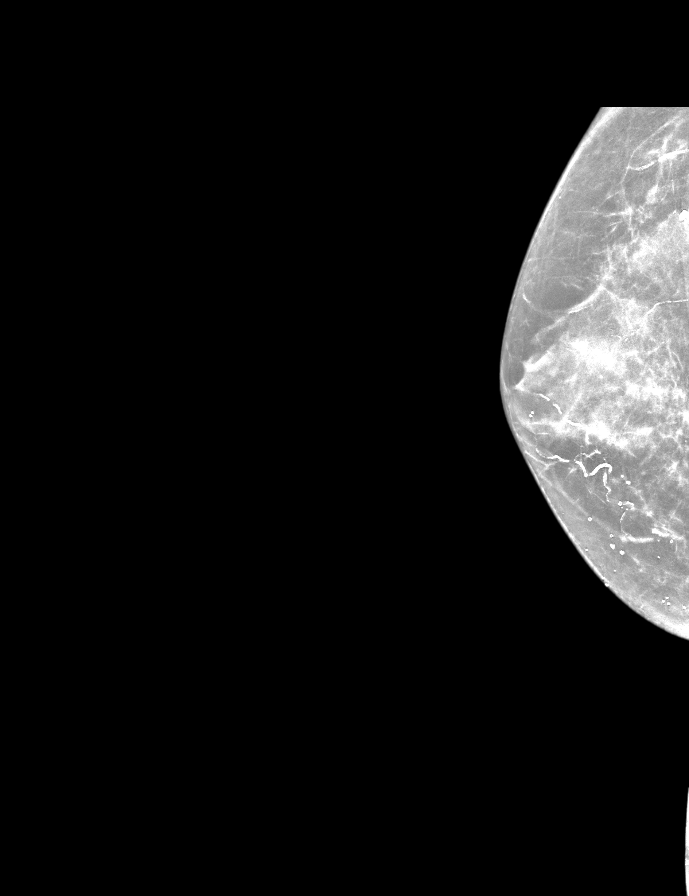

[L CC synth-2D]
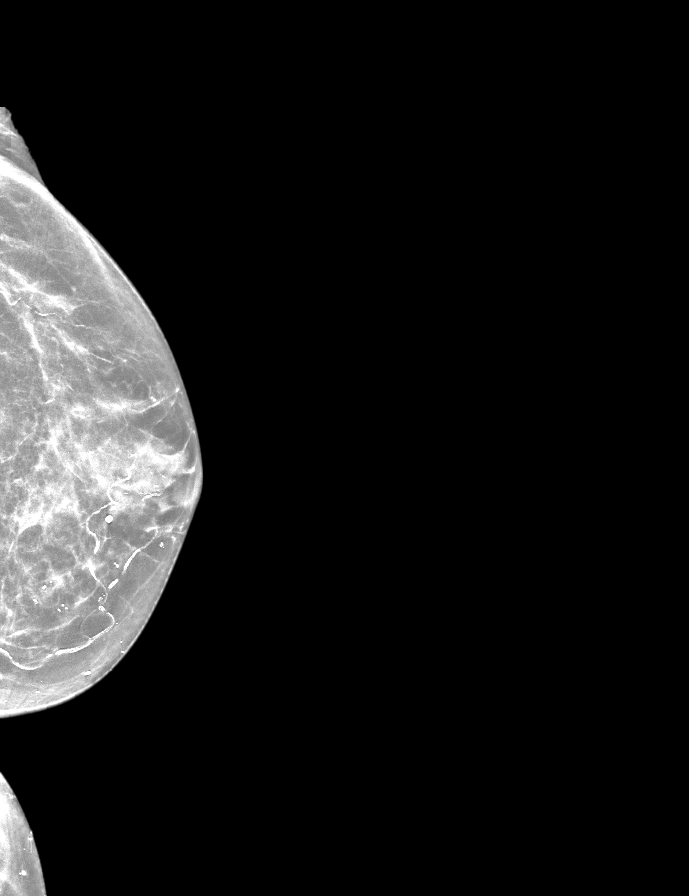

[L MLO synth-2D]
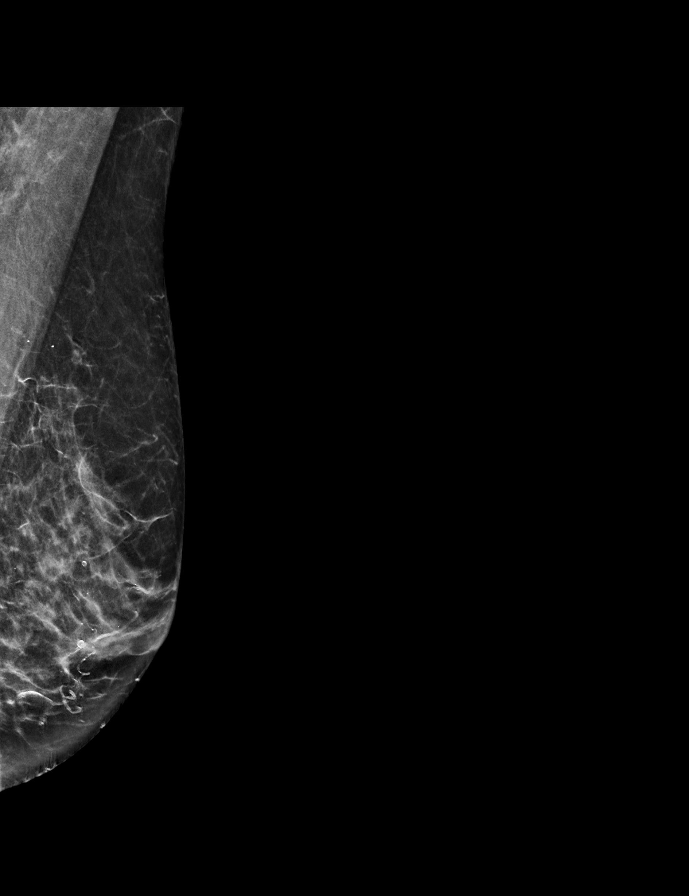

[L MLO tomo · 2 of 50 frames shown]
[frame 17/50]
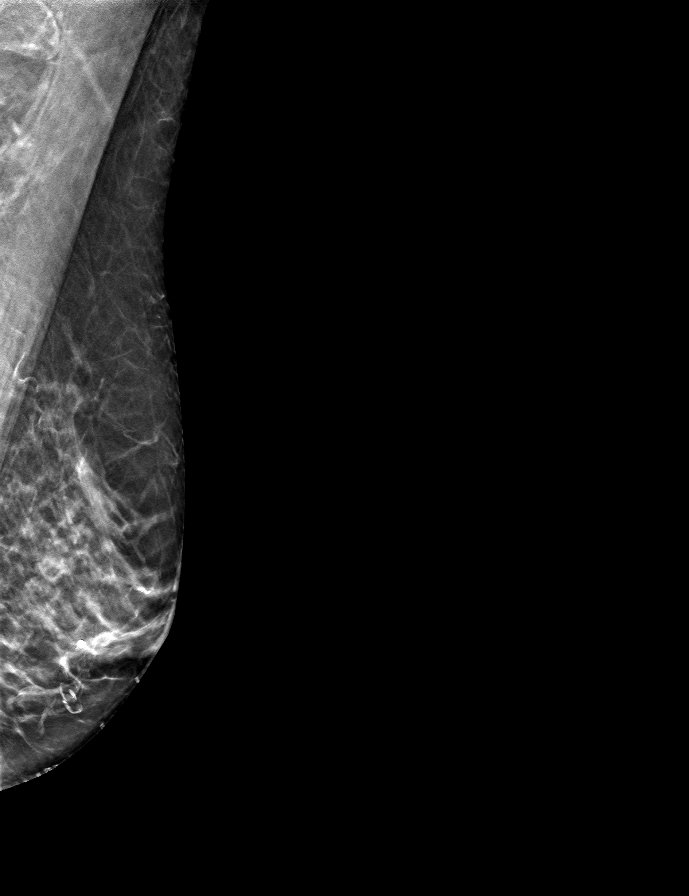
[frame 25/50]
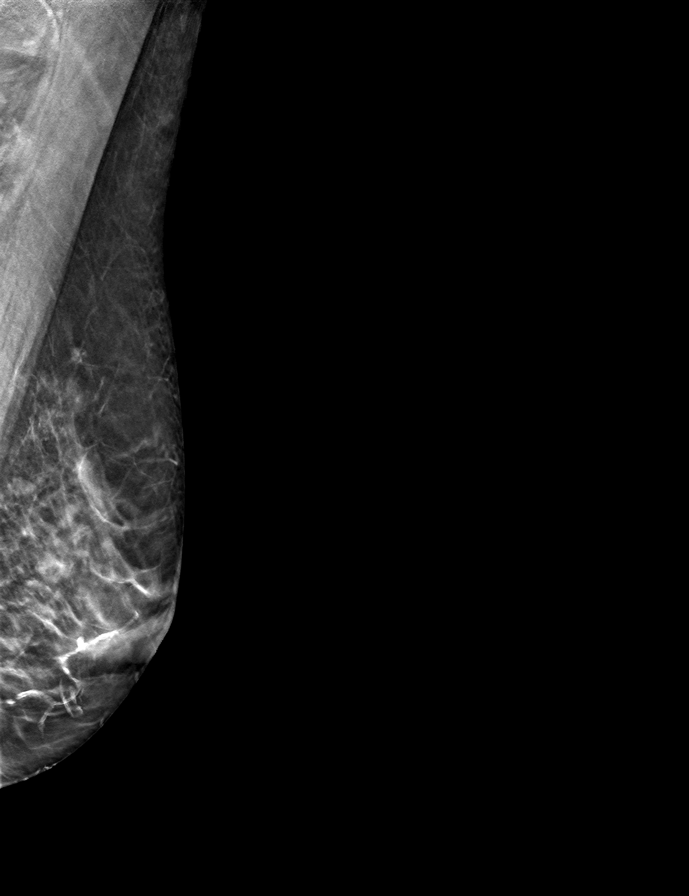

[R MLO tomo · tomo slice 23/44.0]
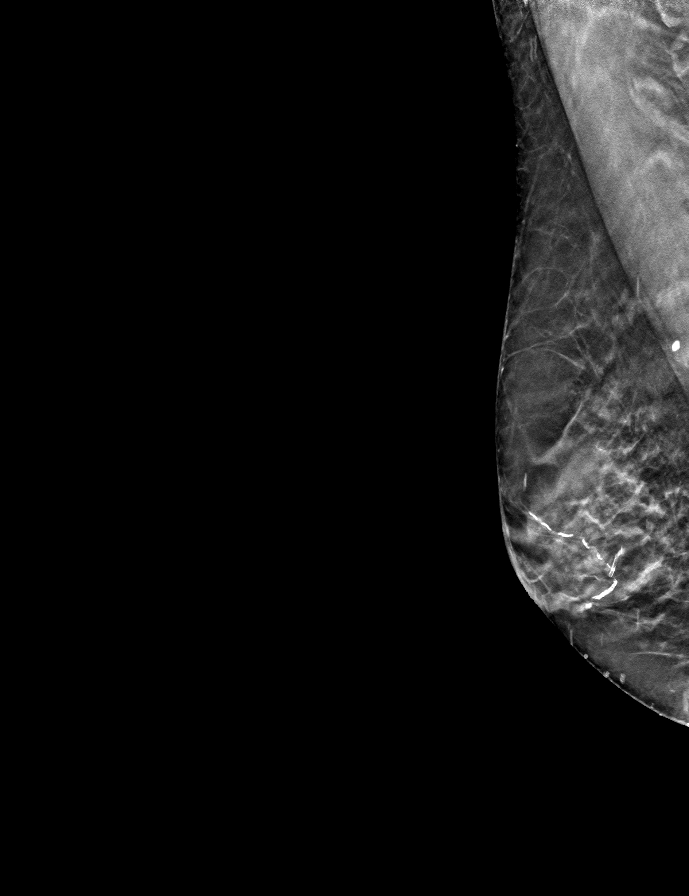

[L CC tomo · tomo slice 25/48.0]
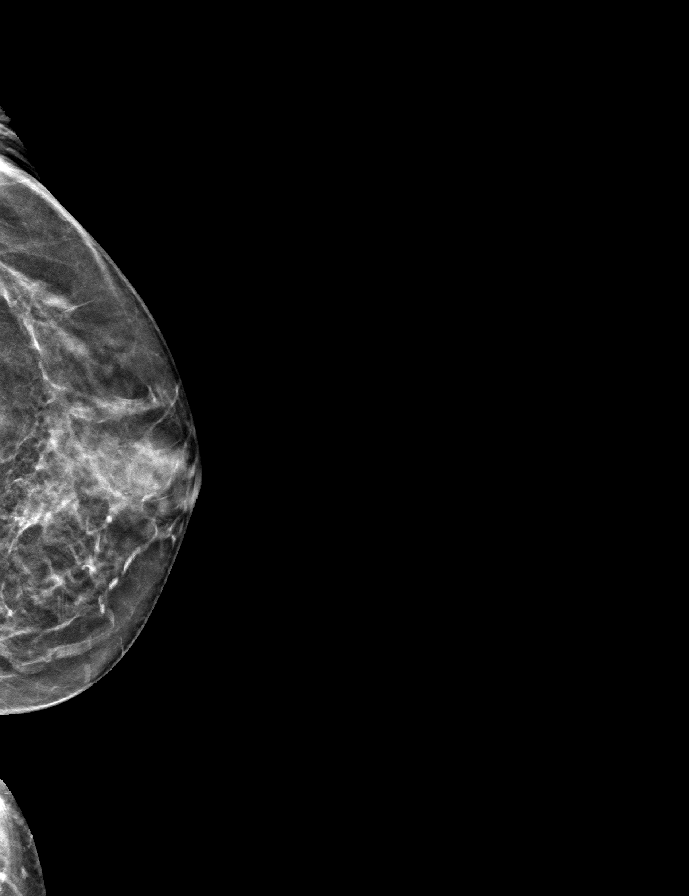

[R CC tomo · tomo slice 27/52.0]
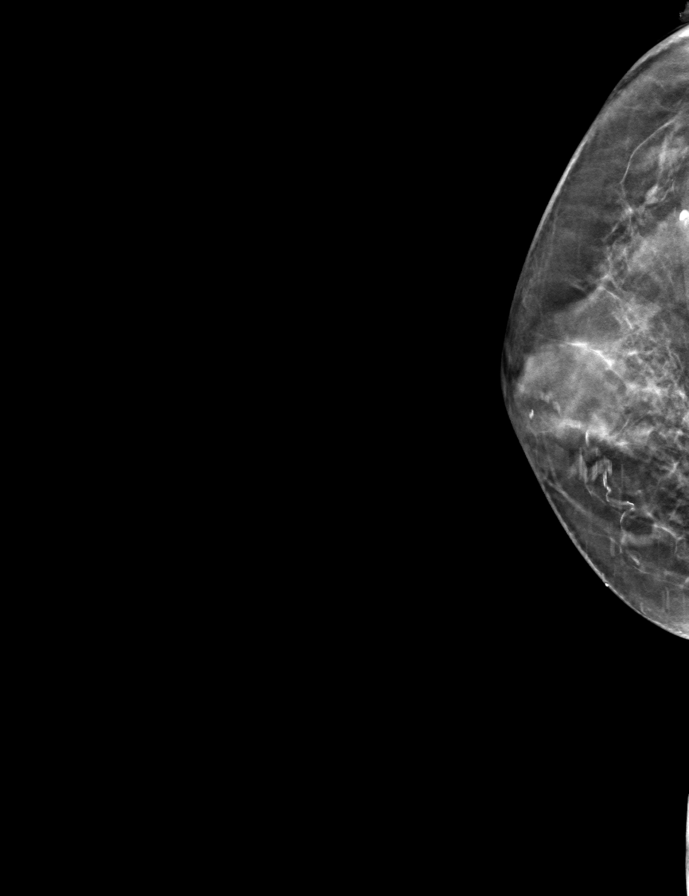

[9 of 24 positions shown; findings below may reference images not displayed]

ACR Breast Density Category c: The breast tissue is heterogeneously
dense, which may obscure small masses.
FINDINGS: There are no findings suspicious for malignancy.
IMPRESSION: No mammographic evidence of malignancy. A result letter of this
screening mammogram will be mailed directly to the patient.

RECOMMENDATION:
Screening mammogram in one year. (Code:Q3-W-BC3)

BI-RADS CATEGORY  1: Negative.

## 2023-07-21 ENCOUNTER — Telehealth: Payer: Self-pay | Admitting: Cardiology

## 2023-07-21 NOTE — Telephone Encounter (Signed)
 Pt requesting cb to advise when she can go to have labs done. Has f/u 4/1 and needs orders put in

## 2023-07-24 ENCOUNTER — Telehealth: Payer: Self-pay

## 2023-07-24 DIAGNOSIS — I480 Paroxysmal atrial fibrillation: Secondary | ICD-10-CM

## 2023-07-24 DIAGNOSIS — I10 Essential (primary) hypertension: Secondary | ICD-10-CM

## 2023-07-24 NOTE — Telephone Encounter (Signed)
 CBC, BMET, digoxin level (but do not take digoxin 8 hours before we check it) prior to April 1 visit- per Wallis Bamberg, NP

## 2023-08-08 ENCOUNTER — Ambulatory Visit: Payer: PPO | Admitting: Cardiology

## 2023-08-09 NOTE — Progress Notes (Signed)
 Cardiology Office Note:  .   Date:  08/12/2023  ID:  Leah Simmons, DOB 08-Sep-1945, MRN [id number] PCP: Olive Bass, MD  [facility] Providers Cardiologist:  Norman Herrlich, MD    History of Present Illness: Leah Simmons is a 78 y.o. female with a past medical history of hypertension, atrial fibrillation, sick sinus syndrome, history of TIA, GERD, rheumatoid arthritis, dyslipidemia.  07/05/2017 echo EF 55 to 60%, impaired relaxation, mild MR, mild TR  Most recently evaluated by Dr. Dulce Sellar in September 2024, she was maintaining sinus rhythm, most bothered with neuropathic pain that developed after COVID.  Some episodes of her blood pressure being low with associated symptoms.  Her digoxin was decreased blood pressure medicine was discontinued, and she was advised to follow-up in 6 months.  She presents today accompanied by family member for follow-up.  She was apparently evaluated in the hospital on 07/24/2023, for hypertensive urgency and brief memory loss.  Apparently she had recently been giving some extremely upsetting information about one of her children and that is the last thing she can recall.  Neurology workup was unrevealing and she was advised to follow-up with her cardiologist.  She is following with a holistic nurse practitioner and trying to manage her blood pressure with natural supplements, we reviewed her blood pressure log and it appears that her blood pressure is well-controlled.  She is currently taking her digoxin every other day, and questions if she should start candesartan back or not. She denies chest pain, palpitations, dyspnea, pnd, orthopnea, n, v, dizziness, syncope, edema, weight gain, or early satiety.    ROS: Review of Systems  All other systems reviewed and are negative.    Studies Reviewed: .        Cardiac Studies & Procedures   ______________________________________________________________________________________________      ECHOCARDIOGRAM  ECHOCARDIOGRAM COMPLETE 07/05/2017          ______________________________________________________________________________________________      Risk Assessment/Calculations:    CHA2DS2-VASc Score = 6   This indicates a 9.7% annual risk of stroke. The patient's score is based upon: CHF History: 0 HTN History: 1 Diabetes History: 0 Stroke History: 2 Vascular Disease History: 0 Age Score: 2 Gender Score: 1        Physical Exam:   VS:  BP (!) 150/90   Pulse 60   Ht 5\' 1"  (1.549 m)   Wt 105 lb 6.4 oz (47.8 kg)   SpO2 98%   BMI 19.92 kg/m    Wt Readings from Last 3 Encounters:  08/10/23 105 lb 6.4 oz (47.8 kg)  01/31/23 106 lb (48.1 kg)  08/08/22 106 lb (48.1 kg)    GEN: Well nourished, well developed in no acute distress NECK: No JVD; No carotid bruits CARDIAC: RRR, no murmurs, rubs, gallops RESPIRATORY:  Clear to auscultation without rales, wheezing or rhonchi  ABDOMEN: Soft, non-tender, non-distended EXTREMITIES:  No edema; No deformity   ASSESSMENT AND PLAN: .   Paroxysmal atrial fibrillation/hypercoagulable state/high risk medication use-CHA2DS2-VASc score of 6.  EKG in September revealed normal sinus rhythm.  Her heart rate is well-controlled in the office today.  Continue Eliquis 5 mg twice daily--no indication for dose reduction, weight is 105 pounds, age of 84 and creatinine 0.76.  Denies hematochezia, hematuria, hemoptysis.  Continue digoxin 0.125 mg half tablet every other day.  Continue flecainide 50 mg twice daily.  Hypertension-blood pressure is elevated in the office today however she has been under a lot of  personal stress, she is also visibly upset in the office.  She is following with a holistic nurse practitioner and taking supplements from their office to keep her blood pressure under control, we reviewed blood pressure log and it appears that her blood pressure is well-controlled at home.  She questions if she should start back on the  candesartan, and advised her that was up to her as I understand she wants to manage with holistic supplements.  I will give her a as needed dose of clonidine if her systolic blood pressure is greater than 170.         Dispo: Clonidine 0.1 mg to take only if her systolic is greater than 170, follow-up in 3 months with Dr. Dulce Sellar.  Signed, Flossie Dibble, NP

## 2023-08-10 ENCOUNTER — Other Ambulatory Visit: Payer: Self-pay | Admitting: Cardiology

## 2023-08-10 ENCOUNTER — Ambulatory Visit: Attending: Cardiology | Admitting: Cardiology

## 2023-08-10 ENCOUNTER — Encounter: Payer: Self-pay | Admitting: Cardiology

## 2023-08-10 VITALS — BP 150/90 | HR 60 | Ht 61.0 in | Wt 105.4 lb

## 2023-08-10 DIAGNOSIS — Z79899 Other long term (current) drug therapy: Secondary | ICD-10-CM | POA: Diagnosis not present

## 2023-08-10 DIAGNOSIS — D6859 Other primary thrombophilia: Secondary | ICD-10-CM | POA: Diagnosis not present

## 2023-08-10 DIAGNOSIS — I10 Essential (primary) hypertension: Secondary | ICD-10-CM | POA: Diagnosis not present

## 2023-08-10 DIAGNOSIS — I480 Paroxysmal atrial fibrillation: Secondary | ICD-10-CM

## 2023-08-10 MED ORDER — POTASSIUM CHLORIDE CRYS ER 20 MEQ PO TBCR: 20.0000 meq | EXTENDED_RELEASE_TABLET | Freq: Two times a day (BID) | ORAL | 2 refills | Status: DC

## 2023-08-10 MED ORDER — CLONIDINE HCL 0.1 MG PO TABS: 0.1000 mg | ORAL_TABLET | Freq: Every day | ORAL | 1 refills | Status: AC | PRN

## 2023-08-10 NOTE — Patient Instructions (Addendum)
 Medication Instructions:    Start Clonidine 0.1 mg once a day as needed for systolic greater than 170  *If you need a refill on your cardiac medications before your next appointment, please call your pharmacy*   Lab Work: None Ordered If you have labs (blood work) drawn today and your tests are completely normal, you will receive your results only by: MyChart Message (if you have MyChart) OR A paper copy in the mail If you have any lab test that is abnormal or we need to change your treatment, we will call you to review the results.   Testing/Procedures: None Ordered   Follow-Up: At [facility], you and your health needs are our priority.  As part of our continuing mission to provide you with exceptional heart care, we have created designated Provider Care Teams.  These Care Teams include your primary Cardiologist (physician) and Advanced Practice Providers (APPs -  Physician Assistants and Nurse Practitioners) who all work together to provide you with the care you need, when you need it.  We recommend signing up for the patient portal called "MyChart".  Sign up information is provided on this After Visit Summary.  MyChart is used to connect with patients for Virtual Visits (Telemedicine).  Patients are able to view lab/test results, encounter notes, upcoming appointments, etc.  Non-urgent messages can be sent to your provider as well.   To learn more about what you can do with MyChart, go to [email or web address].    Your next appointment:   3 month follow up

## 2023-08-11 ENCOUNTER — Ambulatory Visit: Admitting: Cardiology

## 2023-08-13 LAB — DIGOXIN LEVEL: Digoxin, Serum: <0.4 ng/mL — ABNORMAL LOW (ref 0.5–0.9)

## 2023-08-13 LAB — BASIC METABOLIC PANEL WITH GFR
BUN/Creatinine Ratio: 20 (ref 12–28)
BUN: 15 mg/dL (ref 8–27)
CO2: 25 mmol/L (ref 20–29)
Calcium: 9.5 mg/dL (ref 8.7–10.3)
Chloride: 99 mmol/L (ref 96–106)
Creatinine, Ser: 0.76 mg/dL (ref 0.57–1.00)
Glucose: 107 mg/dL — ABNORMAL HIGH (ref 70–99)
Potassium: 4.2 mmol/L (ref 3.5–5.2)
Sodium: 139 mmol/L (ref 134–144)
eGFR: 80 mL/min/1.73

## 2023-08-13 LAB — CBC
Hematocrit: 44.9 % (ref 34.0–46.6)
Hemoglobin: 14.7 g/dL (ref 11.1–15.9)
MCH: 30.9 pg (ref 26.6–33.0)
MCHC: 32.7 g/dL (ref 31.5–35.7)
MCV: 95 fL (ref 79–97)
Platelets: 261 x10E3/uL (ref 150–450)
RBC: 4.75 x10E6/uL (ref 3.77–5.28)
RDW: 12.8 % (ref 11.7–15.4)
WBC: 4.8 x10E3/uL (ref 3.4–10.8)

## 2023-10-27 ENCOUNTER — Other Ambulatory Visit: Payer: Self-pay | Admitting: Cardiology

## 2023-11-09 ENCOUNTER — Ambulatory Visit: Admitting: Cardiology

## 2023-12-04 ENCOUNTER — Other Ambulatory Visit: Payer: Self-pay | Admitting: Cardiology

## 2023-12-05 ENCOUNTER — Telehealth: Payer: Self-pay | Admitting: Cardiology

## 2023-12-05 ENCOUNTER — Other Ambulatory Visit (HOSPITAL_COMMUNITY): Payer: Self-pay

## 2023-12-05 ENCOUNTER — Other Ambulatory Visit: Payer: Self-pay

## 2023-12-05 MED ORDER — FLECAINIDE ACETATE 50 MG PO TABS
50.0000 mg | ORAL_TABLET | Freq: Two times a day (BID) | ORAL | 2 refills | Status: AC
  Filled 2023-12-05 (×2): qty 180, 90d supply, fill #0

## 2023-12-05 NOTE — Telephone Encounter (Signed)
*  STAT* If patient is at the pharmacy, call can be transferred to refill team.   1. Which medications need to be refilled? (please list name of each medication and dose if known)   flecainide  (TAMBOCOR ) 50 MG tablet    2. Which pharmacy/location (including street and city if local pharmacy) is medication to be sent to?  [facility] [phone] - [place], [place] - [address]      3. Do they need a 30 day or 90 day supply? 90 day    Pt is out of medication

## 2023-12-05 NOTE — Telephone Encounter (Signed)
 Medication sent.

## 2023-12-06 ENCOUNTER — Other Ambulatory Visit (HOSPITAL_COMMUNITY): Payer: Self-pay

## 2023-12-15 ENCOUNTER — Telehealth: Payer: Self-pay | Admitting: Cardiology

## 2023-12-15 NOTE — Telephone Encounter (Signed)
Patient called to get orders for lab work.

## 2023-12-18 ENCOUNTER — Other Ambulatory Visit: Payer: Self-pay

## 2023-12-18 DIAGNOSIS — E782 Mixed hyperlipidemia: Secondary | ICD-10-CM

## 2023-12-18 DIAGNOSIS — Z79899 Other long term (current) drug therapy: Secondary | ICD-10-CM

## 2023-12-18 NOTE — Telephone Encounter (Signed)
 Called the patient and informed her of the labs that Dr. Monetta would like for her to have drawn. Patient verbalized understanding and had no further questions at this time.

## 2023-12-28 NOTE — Progress Notes (Signed)
 Cardiology Office Note:    Date:  01/02/2024   ID:  Leah Simmons, DOB March 08, 1946, MRN [id number]  PCP:  Ofilia Lamar CROME, MD  Cardiologist:  Redell Leiter, MD    Referring MD: Ofilia Lamar CROME, MD    ASSESSMENT:    1. Paroxysmal atrial fibrillation (HCC)   2. High risk medication use   3. Long term current use of anticoagulant therapy   4. Essential hypertension   5. Mixed hyperlipidemia    PLAN:    In order of problems listed above:  Continues to do well no recurrent atrial fibrillation continue minimal dose of digoxin  her levels have been low and her current anticoagulant. Continue low-dose digoxin  along with her antiarrhythmic drug flecainide , digoxin  in this isolated case it appears to be beneficial Well-controlled continue her current antihypertensives   Next appointment: 9 months   Medication Adjustments/Labs and Tests Ordered: Current medicines are reviewed at length with the patient today.  Concerns regarding medicines are outlined above.  Orders Placed This Encounter  Procedures   EKG 12-Lead   No orders of the defined types were placed in this encounter.    History of Present Illness:    Leah Simmons is a 78 y.o. female with a hx of paroxysmal atrial fibrillation maintaining sinus rhythm long-term on low-dose flecainide  with chronic anticoagulation digoxin  therapy hypertensive heart disease with congestive heart failure and hyperlipidemia last seen 01/31/2023.  Compliance with diet, lifestyle and medications: Yes  She is very tearful in the office with her son's legal difficulties Not having cardiovascular symptoms of edema shortness of breath chest pain palpitation or syncope Her blood pressure is more labile with her emotional stress but still in range No side effects from her digoxin  I reviewed her labs with her Past Medical History:  Diagnosis Date   Allergic rhinitis 09/06/2015   Anxiety 09/06/2015   Atrophic vaginitis 02/08/2016    Benign hypertension 09/06/2015   Carbuncle 09/13/2021   09/13/2021 : left perineal     Chronic atrial fibrillation (HCC) 07/27/2015   Overview:  2003: dx   Chronic rheumatic arthritis (HCC) 02/08/2016   Congestive heart failure (CHF) (HCC) 04/19/2016   Contusion of nose 07/19/2021   07/19/2021     Contusion, chest wall 07/19/2021   07/19/2021     COVID-19 virus infection 05/16/2022   05/16/2022 : monupiravir     Degeneration of lumbar intervertebral disc 07/18/2013   Ecchymosis 10/17/2022   10/17/2022 : left calf     Essential hypertension 11/07/2014   GERD (gastroesophageal reflux disease) 02/08/2016   High risk medication use 07/03/2017   Hypertensive heart disease 09/06/2015   Idiopathic scoliosis and kyphoscoliosis 07/18/2013   Intertrigo 09/01/2021   09/01/2021     Long term current use of anticoagulant therapy 07/27/2015   Low back pain 07/18/2013   Lumbar disc disease 08/17/2021   08/17/2021: XR multi-level with OA and spurring     Lumbosacral radiculitis 07/18/2013   Lumbosacral spondylosis without myelopathy 07/18/2013   Mixed hyperlipidemia 11/07/2014   Osteoporosis 02/08/2016   Overview:  2002: -2.0 2012: -1.4 2014: -1.9 2017: -1.9 2017: fx humerus   Paresthesia 08/17/2021   08/17/2021: (B) L1     Paroxysmal atrial fibrillation (HCC) 07/27/2015   Overview:  2003: dx   Rectal pain 09/21/2021   Scoliosis of thoracolumbar spine 08/17/2021   Screening for breast cancer 10/19/2016   Screening for cervical cancer 10/19/2016   Screening for colon cancer 10/19/2016   Screening for diabetes mellitus (DM)  10/19/2016   Sensation of pressure in bladder area 09/19/2017   2019   Sick sinus syndrome (HCC) 09/06/2015   Sinusitis 09/22/2015   2017, 2017, 2017   Spinal stenosis of lumbar region 07/18/2013   TIA (transient ischemic attack) 09/06/2015   Overview:  2003: right face sensation changes   UTI symptoms 08/17/2021   Viral respiratory infection 11/02/2020   05/16/2022      Wellness examination 10/19/2016    Current Medications: Current Meds  Medication Sig   ALOE VERA PO Take 800 mg by mouth daily.   B Complex Vitamins (VITAMIN B COMPLEX PO) Take 1 tablet by mouth daily.   Calcium Carb-Cholecalciferol (CALCIUM-VITAMIN D) 500-200 MG-UNIT tablet Take 1 tablet by mouth daily.   Cholecalciferol (VITAMIN D3) 2000 units capsule Take 2,000 Units by mouth daily.   cloNIDine  (CATAPRES ) 0.1 MG tablet Take 1 tablet (0.1 mg total) by mouth daily as needed (Start Clonidine  0.1 mg once a day as needed for systolic greater than 170).   conjugated estrogens (PREMARIN) vaginal cream Place 0.5 g vaginally 2 (two) times a week.   cycloSPORINE (RESTASIS) 0.05 % ophthalmic emulsion Place 1 drop into both eyes daily.   digoxin  (LANOXIN ) 0.125 MG tablet Take 0.5 tablets (0.0625 mg total) by mouth every other day.   diltiazem  (CARDIZEM ) 30 MG tablet Take 30 mg by mouth 4 (four) times daily as needed for heart rate control.   ELIQUIS 5 MG TABS tablet Take 5 mg by mouth 2 (two) times daily.   flecainide  (TAMBOCOR ) 50 MG tablet Take 1 tablet (50 mg total) by mouth 2 (two) times daily.   hydrocortisone 2.5 % cream Apply 1 Application topically 2 (two) times daily.   Magnesium Oxide 140 MG CAPS Take 140 mg by mouth daily.   PARoxetine (PAXIL) 20 MG tablet Take 20 mg by mouth daily.   potassium chloride  SA (KLOR-CON  M) 20 MEQ tablet Take 1 tablet (20 mEq total) by mouth 2 (two) times daily.   traZODone (DESYREL) 50 MG tablet Take 50 mg by mouth at bedtime.      EKGs/Labs/Other Studies Reviewed:    The following studies were reviewed today:  Cardiac Studies & Procedures   ______________________________________________________________________________________________     ECHOCARDIOGRAM  ECHOCARDIOGRAM COMPLETE 07/05/2017          ______________________________________________________________________________________________      EKG Interpretation Date/Time:  Tuesday  January 02 2024 09:42:04 EDT Ventricular Rate:  57 PR Interval:  154 QRS Duration:  84 QT Interval:  398 QTC Calculation: 387 R Axis:   95  Text Interpretation: Sinus bradycardia Rightward axis When compared with ECG of 31-Jan-2023 09:55, No significant change was found Confirmed by Monetta Rogue ([id number]) on 01/02/2024 9:45:04 AM   Recent Labs: 08/11/2023: Hemoglobin 14.7; Platelets 261 12/28/2023: ALT 18; BUN 16; Creatinine, Ser 0.76; Magnesium 2.2; Potassium 4.3; Sodium 141  Recent Lipid Panel    Component Value Date/Time   CHOL 224 (H) 12/28/2023 0808   TRIG 74 12/28/2023 0808   HDL 86 12/28/2023 0808   CHOLHDL 2.6 12/28/2023 0808   LDLCALC 125 (H) 12/28/2023 0808    Physical Exam:    VS:  BP 122/82   Pulse (!) 57   Ht 5' 1 (1.549 m)   Wt 105 lb 9.6 oz (47.9 kg)   SpO2 96%   BMI 19.95 kg/m     Wt Readings from Last 3 Encounters:  01/02/24 105 lb 9.6 oz (47.9 kg)  08/10/23 105 lb 6.4 oz (47.8 kg)  01/31/23 106 lb (48.1 kg)     GEN:  Well nourished, well developed in no acute distress HEENT: Normal NECK: No JVD; No carotid bruits LYMPHATICS: No lymphadenopathy CARDIAC: RRR, no murmurs, rubs, gallops RESPIRATORY:  Clear to auscultation without rales, wheezing or rhonchi  ABDOMEN: Soft, non-tender, non-distended MUSCULOSKELETAL:  No edema; No deformity  SKIN: Warm and dry NEUROLOGIC:  Alert and oriented x 3 PSYCHIATRIC:  Normal affect    Signed, Redell Leiter, MD  01/02/2024 12:02 PM    [facility]

## 2023-12-29 LAB — COMPREHENSIVE METABOLIC PANEL WITH GFR
ALT: 18 IU/L (ref 0–32)
AST: 25 IU/L (ref 0–40)
Albumin: 4.2 g/dL (ref 3.8–4.8)
Alkaline Phosphatase: 63 IU/L (ref 44–121)
BUN/Creatinine Ratio: 21 (ref 12–28)
BUN: 16 mg/dL (ref 8–27)
Bilirubin Total: 0.4 mg/dL (ref 0.0–1.2)
CO2: 27 mmol/L (ref 20–29)
Calcium: 9.4 mg/dL (ref 8.7–10.3)
Chloride: 100 mmol/L (ref 96–106)
Creatinine, Ser: 0.76 mg/dL (ref 0.57–1.00)
Globulin, Total: 2.2 g/dL (ref 1.5–4.5)
Glucose: 79 mg/dL (ref 70–99)
Potassium: 4.3 mmol/L (ref 3.5–5.2)
Sodium: 141 mmol/L (ref 134–144)
Total Protein: 6.4 g/dL (ref 6.0–8.5)
eGFR: 80 mL/min/1.73 (ref >59)

## 2023-12-29 LAB — DIGOXIN LEVEL: Digoxin, Serum: <0.4 ng/mL — ABNORMAL LOW (ref 0.5–0.9)

## 2023-12-29 LAB — LIPID PANEL
Chol/HDL Ratio: 2.6 ratio (ref 0.0–4.4)
Cholesterol, Total: 224 mg/dL — ABNORMAL HIGH (ref 100–199)
HDL: 86 mg/dL (ref >39)
LDL Chol Calc (NIH): 125 mg/dL — ABNORMAL HIGH (ref 0–99)
Triglycerides: 74 mg/dL (ref 0–149)
VLDL Cholesterol Cal: 13 mg/dL (ref 5–40)

## 2023-12-29 LAB — MAGNESIUM: Magnesium: 2.2 mg/dL (ref 1.6–2.3)

## 2024-01-01 ENCOUNTER — Other Ambulatory Visit: Payer: Self-pay

## 2024-01-02 ENCOUNTER — Encounter: Payer: Self-pay | Admitting: Cardiology

## 2024-01-02 ENCOUNTER — Ambulatory Visit: Attending: Cardiology | Admitting: Cardiology

## 2024-01-02 VITALS — BP 122/82 | HR 57 | Ht 61.0 in | Wt 105.6 lb

## 2024-01-02 DIAGNOSIS — E782 Mixed hyperlipidemia: Secondary | ICD-10-CM

## 2024-01-02 DIAGNOSIS — I10 Essential (primary) hypertension: Secondary | ICD-10-CM

## 2024-01-02 DIAGNOSIS — Z79899 Other long term (current) drug therapy: Secondary | ICD-10-CM

## 2024-01-02 DIAGNOSIS — I480 Paroxysmal atrial fibrillation: Secondary | ICD-10-CM | POA: Diagnosis not present

## 2024-01-02 DIAGNOSIS — Z7901 Long term (current) use of anticoagulants: Secondary | ICD-10-CM | POA: Diagnosis not present

## 2024-01-02 NOTE — Patient Instructions (Signed)

## 2024-02-29 ENCOUNTER — Telehealth: Payer: Self-pay | Admitting: Cardiology

## 2024-02-29 DIAGNOSIS — I480 Paroxysmal atrial fibrillation: Secondary | ICD-10-CM

## 2024-02-29 NOTE — Telephone Encounter (Signed)
 Patient stated her health care provider suggested she get an echocardiogram and wants to know if Dr. Monetta will place an order for her to get this test.

## 2024-03-01 NOTE — Telephone Encounter (Signed)
 Pt states that they recommended that she have an updated echo (last 2019). Pt denies any problems other that being under stress. Please advise

## 2024-03-28 ENCOUNTER — Ambulatory Visit: Attending: Cardiology

## 2024-03-28 ENCOUNTER — Encounter: Payer: Self-pay | Admitting: Cardiology

## 2024-03-28 DIAGNOSIS — I480 Paroxysmal atrial fibrillation: Secondary | ICD-10-CM | POA: Diagnosis not present

## 2024-03-28 LAB — ECHOCARDIOGRAM COMPLETE
AR max vel: 1.75 cm2
AV Area VTI: 1.66 cm2
AV Area mean vel: 1.84 cm2
AV Mean grad: 3 mmHg
AV Peak grad: 6.6 mmHg
Ao pk vel: 1.28 m/s
Area-P 1/2: 4.29 cm2
MV M vel: 2.67 m/s
MV Peak grad: 28.5 mmHg
S' Lateral: 2.1 cm

## 2024-06-07 ENCOUNTER — Telehealth: Payer: Self-pay | Admitting: Cardiology

## 2024-06-07 NOTE — Telephone Encounter (Signed)
" °*  STAT* If patient is at the pharmacy, call can be transferred to refill team.   1. Which medications need to be refilled? (please list name of each medication and dose if known) potassium chloride  SA (KLOR-CON  M) 20 MEQ tablet    2. Would you like to learn more about the convenience, safety, & potential cost savings by using the [facility]? No    3. Are you open to using the [facility] (Type [facility]. ). No    4. Which pharmacy/location (including street and city if local pharmacy) is medication to be sent to?[facility] [phone] - [place], [place] - [address]    5. Do they need a 30 day or 90 day supply? 90  Pt is currently out   "

## 2024-06-10 MED ORDER — POTASSIUM CHLORIDE CRYS ER 20 MEQ PO TBCR: 20.0000 meq | EXTENDED_RELEASE_TABLET | Freq: Two times a day (BID) | ORAL | 1 refills | Status: AC

## 2024-06-10 NOTE — Telephone Encounter (Signed)
 Refill sent

## 2024-07-05 ENCOUNTER — Ambulatory Visit: Admitting: Cardiology
